# Patient Record
Sex: Female | Born: 1955 | Race: White | Hispanic: No | Marital: Married | Smoking: Former smoker
Health system: Southern US, Community
[De-identification: ages and names within clinical notes are randomized; demographics above are authoritative.]

## PROBLEM LIST (undated history)

## (undated) DIAGNOSIS — L570 Actinic keratosis: Secondary | ICD-10-CM

## (undated) DIAGNOSIS — E785 Hyperlipidemia, unspecified: Secondary | ICD-10-CM

## (undated) HISTORY — DX: Actinic keratosis: L57.0

## (undated) HISTORY — PX: TONSILLECTOMY: SUR1361

## (undated) HISTORY — DX: Hyperlipidemia, unspecified: E78.5

---

## 2006-03-13 ENCOUNTER — Ambulatory Visit: Payer: Self-pay | Admitting: Obstetrics & Gynecology

## 2007-10-15 ENCOUNTER — Ambulatory Visit: Payer: Self-pay | Admitting: Obstetrics & Gynecology

## 2008-01-28 ENCOUNTER — Ambulatory Visit: Payer: Self-pay | Admitting: Gastroenterology

## 2010-02-01 ENCOUNTER — Ambulatory Visit: Payer: Self-pay | Admitting: Obstetrics & Gynecology

## 2010-02-15 ENCOUNTER — Ambulatory Visit: Payer: Self-pay | Admitting: Obstetrics & Gynecology

## 2010-02-18 ENCOUNTER — Ambulatory Visit: Payer: Self-pay | Admitting: Obstetrics & Gynecology

## 2011-03-06 ENCOUNTER — Ambulatory Visit: Payer: Self-pay | Admitting: Obstetrics & Gynecology

## 2012-01-10 IMAGING — US ULTRASOUND LEFT BREAST
1 series · 8 of 8 positions shown · non-contrast
Comparison: none

REASON FOR EXAM: NODULARITY
COMMENTS:

PROCEDURE:     US  - US BREAST LEFT  - February 18, 2010 [DATE]
RESULT:     Ultrasound was performed from the 8 o'clock to the 10 o'clock
position.  No discrete cystic or solid mass is demonstrated. The echotexture
of the imaged parenchyma is normal.

[Series 1: ultrasound left breast · 8 of 8 slices shown]
[im 1/8]
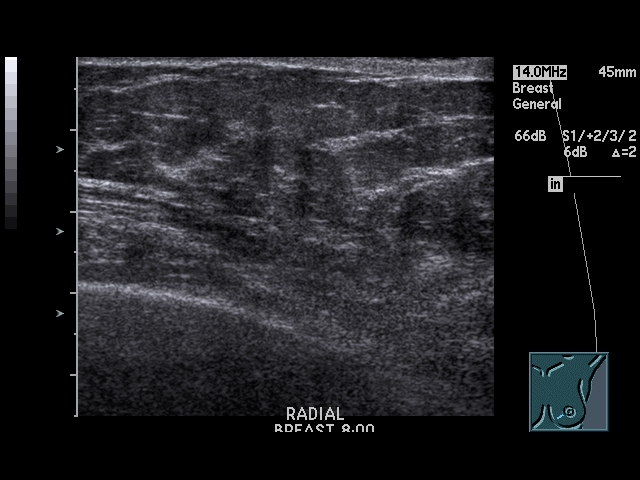
[im 2/8]
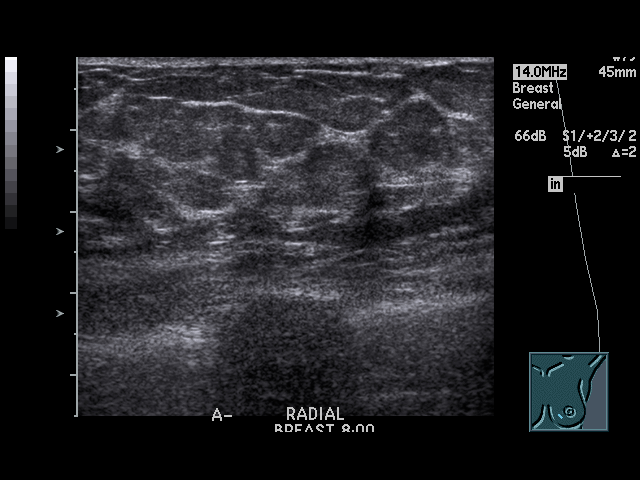
[im 3/8]
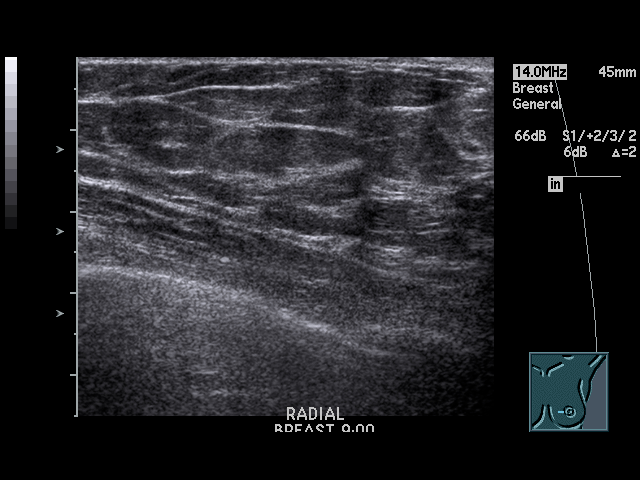
[im 4/8]
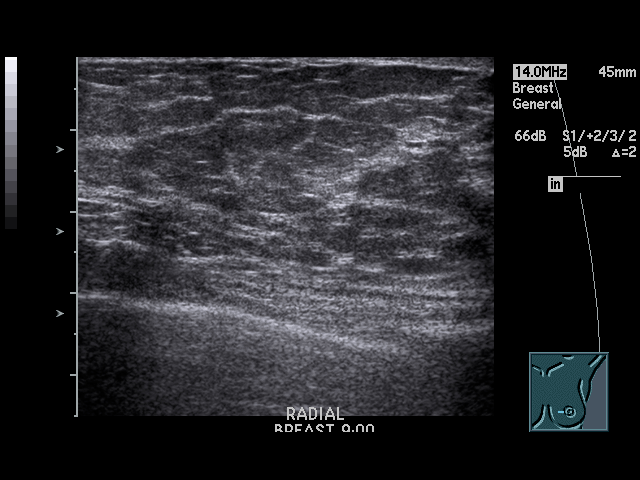
[im 5/8]
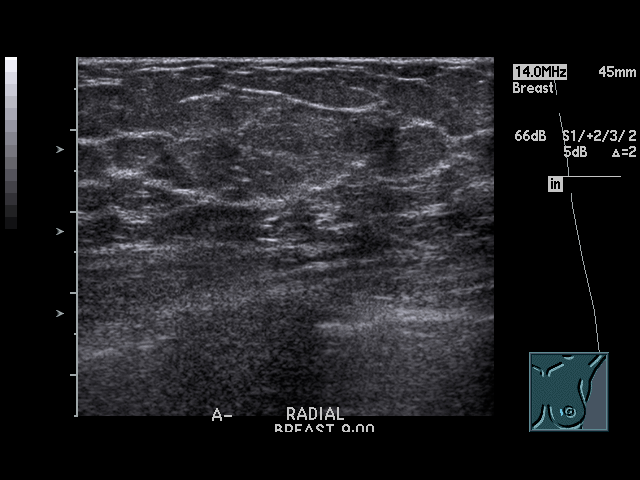
[im 6/8]
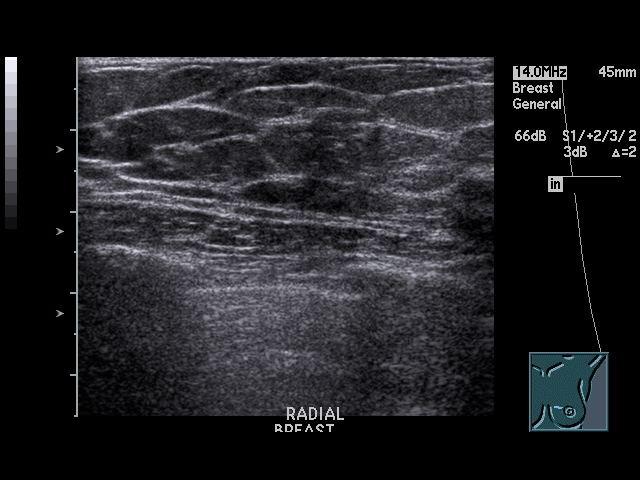
[im 7/8]
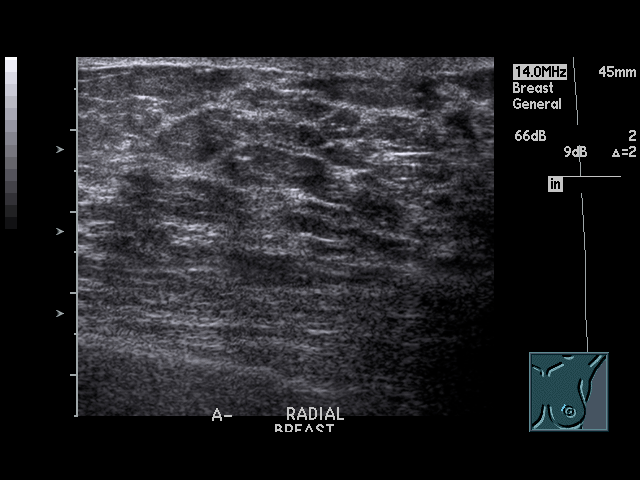
[im 8/8]
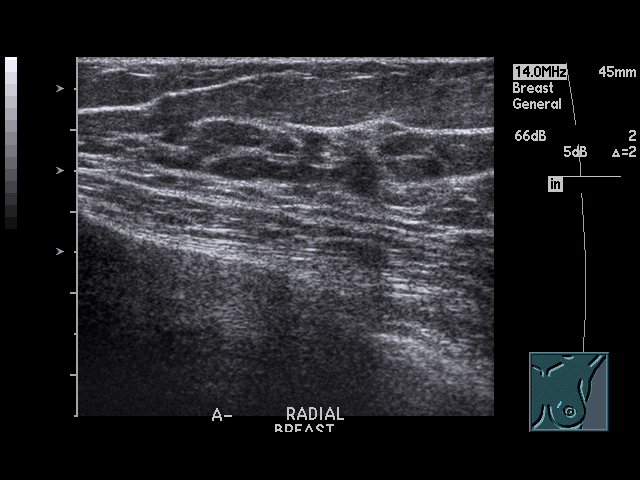

[8 of 8 positions shown; findings below may reference images not displayed]

IMPRESSION: 1.I see no ultrasonic abnormality on this directed ultrasound. Please see
the dictation of the supplemental mammographic views of the left breast this
same day for final recommendations and BI-RADS classification.

## 2012-08-15 ENCOUNTER — Ambulatory Visit: Payer: Self-pay | Admitting: Family Medicine

## 2014-07-10 LAB — LIPID PANEL
Cholesterol: 200 mg/dL (ref 0–200)
HDL: 53 mg/dL (ref 35–70)
LDL Cholesterol: 135 mg/dL
Triglycerides: 61 mg/dL (ref 40–160)

## 2014-07-10 LAB — BASIC METABOLIC PANEL
BUN: 15 mg/dL (ref 4–21)
CREATININE: 0.9 mg/dL (ref 0.5–1.1)
GLUCOSE: 92 mg/dL
POTASSIUM: 4.8 mmol/L (ref 3.4–5.3)
Sodium: 138 mmol/L (ref 137–147)

## 2015-05-26 DIAGNOSIS — E559 Vitamin D deficiency, unspecified: Secondary | ICD-10-CM | POA: Insufficient documentation

## 2015-05-26 DIAGNOSIS — I8390 Asymptomatic varicose veins of unspecified lower extremity: Secondary | ICD-10-CM | POA: Insufficient documentation

## 2015-05-26 DIAGNOSIS — L719 Rosacea, unspecified: Secondary | ICD-10-CM | POA: Insufficient documentation

## 2015-05-26 DIAGNOSIS — E7800 Pure hypercholesterolemia, unspecified: Secondary | ICD-10-CM | POA: Insufficient documentation

## 2015-05-28 ENCOUNTER — Other Ambulatory Visit: Payer: Self-pay | Admitting: Family Medicine

## 2015-05-28 DIAGNOSIS — Z1231 Encounter for screening mammogram for malignant neoplasm of breast: Secondary | ICD-10-CM

## 2015-06-07 ENCOUNTER — Ambulatory Visit: Payer: Self-pay

## 2015-07-13 ENCOUNTER — Encounter: Payer: Self-pay | Admitting: Family Medicine

## 2015-07-14 ENCOUNTER — Encounter: Payer: Self-pay | Admitting: Family Medicine

## 2015-07-14 ENCOUNTER — Ambulatory Visit (INDEPENDENT_AMBULATORY_CARE_PROVIDER_SITE_OTHER): Payer: BC Managed Care – PPO | Admitting: Family Medicine

## 2015-07-14 VITALS — BP 108/66 | HR 70 | Temp 98.6°F | Resp 14 | Ht 65.0 in | Wt 154.6 lb

## 2015-07-14 DIAGNOSIS — J309 Allergic rhinitis, unspecified: Secondary | ICD-10-CM | POA: Insufficient documentation

## 2015-07-14 DIAGNOSIS — E559 Vitamin D deficiency, unspecified: Secondary | ICD-10-CM

## 2015-07-14 DIAGNOSIS — Z0000 Encounter for general adult medical examination without abnormal findings: Secondary | ICD-10-CM | POA: Diagnosis not present

## 2015-07-14 NOTE — Patient Instructions (Signed)
We will call you with the lab results. 

## 2015-07-14 NOTE — Progress Notes (Signed)
Subjective:     Patient ID: Paula Hodges, female   DOB: Dec 28, 1955, 59 y.o.   MRN: [id number]  HPI  Chief Complaint  Patient presents with  . Annual Exam    Patient is present in office today for her annual physical, she reports that she has no concerns or questions today. Last pap smear was 06/05/13 (WNL,HPV negative), mammogram is overdue, Tdap 11/15/11, Flu vaccine 05/2015.  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   Review of Systems General: Feeling well HEENT: regular dental visits and recent eye exam Cardiovascular: no chest pain, shortness of breath, or palpitations GI: no heartburn, no change in bowel habits or blood in the stool. Due colonoscopy in 2019. GU:  no change in bladder habits. Defers mammogram: "If I have cancer I don't want to know." Repeat pap/HPV in 2019. Psychiatric: not depressed Musculoskeletal: no joint pain. States she takes Vitamin D supplement in the winter. Skin: sees a dermatologist annually.    Objective:   Physical Exam  Constitutional: She appears well-developed and well-nourished. No distress.  Eyes: PERRLA, EOMI Neck: no thyromegaly, tenderness or nodules, no carotid bruits ENT: TM's intact without inflammation; No tonsillar enlargement or exudate, Lungs: Clear Breasts: no masses/ nipple discharge; no axillary adenopathy Heart : RRR without murmur or gallop Abd: bowel sounds present, soft, non-tender, no organomegaly Extremities: no edema.      Assessment:    1. Annual physical exam - Comprehensive metabolic panel - Lipid panel  2. Vitamin D deficiency - Vitamin D (25 hydroxy)    Plan:    Further f/u pending lab work. Asked her to reconsider mammogram as she did not get last year.

## 2015-07-15 ENCOUNTER — Telehealth: Payer: Self-pay

## 2015-07-15 LAB — COMPREHENSIVE METABOLIC PANEL
A/G RATIO: 2 (ref 1.1–2.5)
ALT: 16 IU/L (ref 0–32)
AST: 19 IU/L (ref 0–40)
Albumin: 4.4 g/dL (ref 3.5–5.5)
Alkaline Phosphatase: 60 IU/L (ref 39–117)
BILIRUBIN TOTAL: 0.7 mg/dL (ref 0.0–1.2)
BUN/Creatinine Ratio: 13 (ref 9–23)
BUN: 11 mg/dL (ref 6–24)
CALCIUM: 9.3 mg/dL (ref 8.7–10.2)
CHLORIDE: 100 mmol/L (ref 96–106)
CO2: 24 mmol/L (ref 18–29)
Creatinine, Ser: 0.86 mg/dL (ref 0.57–1.00)
GFR calc Af Amer: 86 mL/min/{1.73_m2} (ref >59)
GFR calc non Af Amer: 74 mL/min/{1.73_m2} (ref >59)
Globulin, Total: 2.2 g/dL (ref 1.5–4.5)
Glucose: 96 mg/dL (ref 65–99)
POTASSIUM: 4.3 mmol/L (ref 3.5–5.2)
Sodium: 140 mmol/L (ref 134–144)
Total Protein: 6.6 g/dL (ref 6.0–8.5)

## 2015-07-15 LAB — LIPID PANEL
Chol/HDL Ratio: 3.7 ratio units (ref 0.0–4.4)
Cholesterol, Total: 200 mg/dL — ABNORMAL HIGH (ref 100–199)
HDL: 54 mg/dL (ref >39)
LDL Calculated: 132 mg/dL — ABNORMAL HIGH (ref 0–99)
TRIGLYCERIDES: 70 mg/dL (ref 0–149)
VLDL Cholesterol Cal: 14 mg/dL (ref 5–40)

## 2015-07-15 LAB — VITAMIN D 25 HYDROXY (VIT D DEFICIENCY, FRACTURES): Vit D, 25-Hydroxy: 29.9 ng/mL — ABNORMAL LOW (ref 30.0–100.0)

## 2015-07-15 NOTE — Telephone Encounter (Signed)
Patient has been advised of lab report. KW 

## 2015-07-15 NOTE — Telephone Encounter (Signed)
-----   Message from Carmon Ginsberg, [place] sent at 07/15/2015  7:45 AM EST ----- Labs look good.  Your risk of developing cardiovascular disease over the next 10 years remains low at 2.2%.

## 2016-12-12 ENCOUNTER — Ambulatory Visit (INDEPENDENT_AMBULATORY_CARE_PROVIDER_SITE_OTHER): Payer: BC Managed Care – PPO | Admitting: Family Medicine

## 2016-12-12 ENCOUNTER — Encounter: Payer: Self-pay | Admitting: Family Medicine

## 2016-12-12 VITALS — BP 108/80 | HR 77 | Temp 98.4°F | Resp 16 | Ht 64.5 in | Wt 148.6 lb

## 2016-12-12 DIAGNOSIS — Z0000 Encounter for general adult medical examination without abnormal findings: Secondary | ICD-10-CM

## 2016-12-12 DIAGNOSIS — E7800 Pure hypercholesterolemia, unspecified: Secondary | ICD-10-CM

## 2016-12-12 DIAGNOSIS — M7061 Trochanteric bursitis, right hip: Secondary | ICD-10-CM | POA: Diagnosis not present

## 2016-12-12 DIAGNOSIS — E559 Vitamin D deficiency, unspecified: Secondary | ICD-10-CM

## 2016-12-12 NOTE — Progress Notes (Signed)
Subjective:     Patient ID: Paula Hodges, female   DOB: [birth year], 61 y.o.   MRN: [id number]  HPI  Chief Complaint  Patient presents with  . Annual Exam    Patient is here today for her annual physical she states that she has been having pain in her right leg since Friday  12/08/16 while sleeping. Patient reports she she has pain in her legs often and wasnt sure if it could be related to tick bite that she pulled off her left leg on 11/30/16. 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 She cares for and rides horses as her primary exercise. Reports she has been squatting a lot playing with her grandchildren prior to the onset of her right leg pain. States it was so sore that she could not lie on her right side.Has improved with the use of ibuprofen.    Review of Systems General: Feeling well, Still does not wish to get a mammogram HEENT: regular dental visits and eye exams (reading glasses) Cardiovascular: no chest pain, shortness of breath, dizziness or palpitations GI: no heartburn, no change in bowel habits or blood in the stool. Due colonoscopy in 2019. GU:  no change in bladder habits, Due pap in 2019 (last in 2014 with negative HPV). Psychiatric: not depressed Musculoskeletal: right leg pain as above. States she takes Vitamin D in the winter. Skin: states she gets annual skin survey from dermatology.    Objective:   Physical Exam Eyes: PERRLA, EOMI Neck: no thyromegaly, tenderness or nodules, no carotid bruits or cervical adenopathy. ENT: TM's intact without inflammation; No tonsillar enlargement or exudate, Lungs: Clear Breasts: no breast mass or nipple discharge, no axillary adenopathy Heart : RRR without murmur or gallop Abd: bowel sounds present, soft, non-tender, no  organomegaly Extremities: no edema, Mild tenderness over her right trochanteric area. M.S. In lower extremities 5/5 with SLR's to 90 degrees without back pain or radiating pain.     Assessment:    1. Annual physical exam - Comprehensive metabolic panel  2. Hypercholesterolemia without hypertriglyceridemia - Lipid panel  3. Vitamin D deficiency - VITAMIN D 25 Hydroxy (Vit-D Deficiency, Fractures)  4. Trochanteric bursitis of right hip: improving    Plan:    Further f/u pending lab work. Consider Shingrix vaccine and a mammogram. Continue ibuprofen for leg pain.

## 2016-12-12 NOTE — Patient Instructions (Addendum)
Consider getting Shingrix vaccine and a mammogram. We will call you with the lab results. Continue ibuprofen for your leg.

## 2016-12-13 ENCOUNTER — Telehealth: Payer: Self-pay

## 2016-12-13 LAB — COMPREHENSIVE METABOLIC PANEL
A/G RATIO: 1.7 (ref 1.2–2.2)
ALBUMIN: 4.3 g/dL (ref 3.6–4.8)
ALK PHOS: 56 IU/L (ref 39–117)
ALT: 11 IU/L (ref 0–32)
AST: 19 IU/L (ref 0–40)
BILIRUBIN TOTAL: 0.8 mg/dL (ref 0.0–1.2)
BUN/Creatinine Ratio: 13 (ref 12–28)
BUN: 10 mg/dL (ref 8–27)
CHLORIDE: 98 mmol/L (ref 96–106)
CO2: 23 mmol/L (ref 18–29)
Calcium: 9.5 mg/dL (ref 8.7–10.3)
Creatinine, Ser: 0.77 mg/dL (ref 0.57–1.00)
GFR calc Af Amer: 97 mL/min/{1.73_m2} (ref >59)
GFR calc non Af Amer: 84 mL/min/{1.73_m2} (ref >59)
GLUCOSE: 94 mg/dL (ref 65–99)
Globulin, Total: 2.5 g/dL (ref 1.5–4.5)
POTASSIUM: 5.3 mmol/L — AB (ref 3.5–5.2)
Sodium: 138 mmol/L (ref 134–144)
TOTAL PROTEIN: 6.8 g/dL (ref 6.0–8.5)

## 2016-12-13 LAB — VITAMIN D 25 HYDROXY (VIT D DEFICIENCY, FRACTURES): VIT D 25 HYDROXY: 30.1 ng/mL (ref 30.0–100.0)

## 2016-12-13 LAB — LIPID PANEL
CHOLESTEROL TOTAL: 169 mg/dL (ref 100–199)
Chol/HDL Ratio: 3.5 ratio (ref 0.0–4.4)
HDL: 48 mg/dL (ref >39)
LDL Calculated: 107 mg/dL — ABNORMAL HIGH (ref 0–99)
Triglycerides: 71 mg/dL (ref 0–149)
VLDL CHOLESTEROL CAL: 14 mg/dL (ref 5–40)

## 2016-12-13 NOTE — Telephone Encounter (Signed)
-----   Message from Carmon Ginsberg, [place] sent at 12/13/2016  7:39 AM EDT ----- Your labs look good. Your cholesterol is minimally elevated with a low 10 year calculated risk of developing cardiovascular disease of 2.3%.

## 2016-12-13 NOTE — Telephone Encounter (Signed)
Patient advised.KW 

## 2017-06-25 ENCOUNTER — Other Ambulatory Visit: Payer: Self-pay | Admitting: Family Medicine

## 2017-06-25 DIAGNOSIS — Z1231 Encounter for screening mammogram for malignant neoplasm of breast: Secondary | ICD-10-CM

## 2017-07-16 ENCOUNTER — Ambulatory Visit
Admission: RE | Admit: 2017-07-16 | Discharge: 2017-07-16 | Disposition: A | Discharge status: Home / Self Care | Payer: BC Managed Care – PPO | Source: Ambulatory Visit | Attending: Family Medicine | Admitting: Family Medicine

## 2017-07-16 DIAGNOSIS — Z1231 Encounter for screening mammogram for malignant neoplasm of breast: Secondary | ICD-10-CM | POA: Diagnosis not present

## 2017-12-14 ENCOUNTER — Encounter: Payer: Self-pay | Admitting: Family Medicine

## 2017-12-14 ENCOUNTER — Ambulatory Visit (INDEPENDENT_AMBULATORY_CARE_PROVIDER_SITE_OTHER): Payer: BC Managed Care – PPO | Admitting: Family Medicine

## 2017-12-14 VITALS — BP 100/70 | HR 64 | Temp 97.7°F | Resp 16 | Ht 65.5 in | Wt 146.0 lb

## 2017-12-14 DIAGNOSIS — E7800 Pure hypercholesterolemia, unspecified: Secondary | ICD-10-CM | POA: Diagnosis not present

## 2017-12-14 DIAGNOSIS — Z0000 Encounter for general adult medical examination without abnormal findings: Secondary | ICD-10-CM

## 2017-12-14 NOTE — Progress Notes (Signed)
  Subjective:     Patient ID: Paula Hodges, female   DOB: 1956-06-27, 62 y.o.   MRN: [id number] Chief Complaint  Patient presents with  . Annual Exam    Patient comes in office today for her annual physical she states that she feels well today and has no concerns to address. Patient is following a well balanced diet and staying active daily with farm work, patient states that she sleeps on average 8hrs a night. Patients last reported Tdap 11/15/11, Pap smear 2014, mammogram 12/17/108 and BMD 02/01/10. Patient is due for colonoscopy, pap smear and BMD, patient states that she does monthly breast self checks and has not noticed in change in her breast.    HPI States she will be going to [facility] ob-gyn for health maintenance this summer. Reports annual dermatology exam. Defers colonoscopy/Cologard. 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  Review of Systems General: Feeling well HEENT: regular dental visits and will be updating eye exam (readers) Cardiovascular: no chest pain, shortness of breath, or palpitations GI: no heartburn, no change in bowel habits or blood in the stool GU:  no change in bladder habits; due this year for pap. Psychiatric: not depressed Musculoskeletal: no joint pain    Objective:   Physical Exam  Constitutional: She appears well-developed and well-nourished. No distress.  Eyes: PERRLA, EOMI Neck: no thyromegaly, tenderness or nodules, no cervical adenopathy, no carotid bruits ENT: TM's intact without inflammation; No tonsillar enlargement or exudate, Lungs: Clear Heart : RRR without murmur or gallop Abd: bowel sounds present, soft, non-tender, no organomegaly Extremities: no edema Skin: seb.keratosis on her neck     Assessment:    1. Annual physical exam - Comprehensive metabolic panel  2. Hypercholesterolemia without hypertriglyceridemia - Lipid panel    Plan:    Rx for Shingrix vaccine.Further f/u pending lab results. Encouraged colon cancer screening and  ob-gyn followup.

## 2017-12-14 NOTE — Patient Instructions (Signed)
We will call you with the lab results. Do update your gyn exam this summer. Get Shingrix vaccine when available. Consider colonoscopy or Cologard for colon cancer screening.

## 2017-12-15 LAB — COMPREHENSIVE METABOLIC PANEL
A/G RATIO: 2.1 (ref 1.2–2.2)
ALT: 13 IU/L (ref 0–32)
AST: 18 IU/L (ref 0–40)
Albumin: 4.5 g/dL (ref 3.6–4.8)
Alkaline Phosphatase: 58 IU/L (ref 39–117)
BUN/Creatinine Ratio: 15 (ref 12–28)
BUN: 12 mg/dL (ref 8–27)
Bilirubin Total: 0.6 mg/dL (ref 0.0–1.2)
CALCIUM: 9.3 mg/dL (ref 8.7–10.3)
CO2: 23 mmol/L (ref 20–29)
CREATININE: 0.81 mg/dL (ref 0.57–1.00)
Chloride: 99 mmol/L (ref 96–106)
GFR, EST AFRICAN AMERICAN: 91 mL/min/{1.73_m2} (ref >59)
GFR, EST NON AFRICAN AMERICAN: 79 mL/min/{1.73_m2} (ref >59)
Globulin, Total: 2.1 g/dL (ref 1.5–4.5)
Glucose: 87 mg/dL (ref 65–99)
POTASSIUM: 4.3 mmol/L (ref 3.5–5.2)
Sodium: 137 mmol/L (ref 134–144)
TOTAL PROTEIN: 6.6 g/dL (ref 6.0–8.5)

## 2017-12-15 LAB — LIPID PANEL
Chol/HDL Ratio: 3.5 ratio (ref 0.0–4.4)
Cholesterol, Total: 180 mg/dL (ref 100–199)
HDL: 51 mg/dL (ref >39)
LDL Calculated: 116 mg/dL — ABNORMAL HIGH (ref 0–99)
Triglycerides: 65 mg/dL (ref 0–149)
VLDL Cholesterol Cal: 13 mg/dL (ref 5–40)

## 2017-12-25 ENCOUNTER — Telehealth: Payer: Self-pay

## 2017-12-25 NOTE — Telephone Encounter (Signed)
Left message advising pt. OK per DPR. 

## 2017-12-25 NOTE — Telephone Encounter (Signed)
-----   Message from Carmon Ginsberg, [place] sent at 12/17/2017  7:34 AM EDT ----- Labs look good. Mild elevation of LDL cholesterol but your calcualted 10 year risk for developing cardiovascular disease is low at 2.7 %. We usually recommend cholesterol lowering drugs at 7.5% risk.

## 2019-03-11 NOTE — Progress Notes (Signed)
Subjective:    Patient ID: Paula Hodges, female    DOB: 07-Jan-1956, 63 y.o.   MRN: [id number]  HPI:  Paula Hodges is here to establish as a new pt.  Paula Hodges is a pleasant 63 year old female. PMH:HLD, Vit D Def Paula Hodges leads a VERY active life- 7 days a week of walking, horse back riding, and yoga Paula Hodges drinks >80 oz water/day Paula Hodges grown most of her own food Paula Hodges follow heart healthy diet Paula Hodges denies tobacco/vape/ETOH use Paula Hodges will retire next month-congratulations Paula Hodges reports decline in vision- has not had dilated eye examination in >3 years- recommending returning to her established Ophthalmologist   Patient Care Team    Relationship Specialty Notifications Start End  Carmon Ginsberg, [place] PCP - General Family Medicine  12/12/16     Patient Active Problem List   Diagnosis Date Noted  . Healthcare maintenance 03/12/2019  . Hypercholesterolemia without hypertriglyceridemia 05/26/2015  . Vitamin D deficiency 05/26/2015     Past Medical History:  Diagnosis Date  . Hyperlipidemia      Past Surgical History:  Procedure Laterality Date  . TONSILLECTOMY       Family History  Problem Relation Age of Onset  . Lymphoma Mother   . Lung cancer Father   . Liver cancer Father   . Hypertension Father   . Hyperlipidemia Father   . Obesity Sister   . Hypertension Brother   . Hyperlipidemia Brother   . Diabetes Paternal Uncle      Social History   Substance and Sexual Activity  Drug Use No     Social History   Substance and Sexual Activity  Alcohol Use Not Currently  . Alcohol/week: 0.0 standard drinks     Social History   Tobacco Use  Smoking Status Former Smoker  . Years: 10.00  . Quit date: 08/01/1979  . Years since quitting: 39.6  Smokeless Tobacco Never Used     Outpatient Encounter Medications as of 03/12/2019  Medication Sig Note  . MULTIPLE VITAMIN PO Take by mouth. 05/26/2015: Received from: [facility]  . TURMERIC PO Take by mouth daily.    No  facility-administered encounter medications on file as of 03/12/2019.     Allergies: Patient has no known allergies.  Body mass index is 24.14 kg/m.  Blood pressure 136/72, pulse 76, temperature 98.5 F (36.9 C), temperature source Oral, height 5' 5.25" (1.657 m), weight 146 lb 3.2 oz (66.3 kg), SpO2 100 %.     Review of Systems  Constitutional: Positive for fatigue. Negative for activity change, appetite change, chills, diaphoresis, fever and unexpected weight change.  HENT: Negative for congestion.   Eyes: Positive for visual disturbance.  Respiratory: Negative for cough, chest tightness, shortness of breath, wheezing and stridor.   Cardiovascular: Negative for chest pain, palpitations and leg swelling.  Gastrointestinal: Negative for abdominal distention, anal bleeding, blood in stool, constipation, diarrhea, nausea and vomiting.  Endocrine: Negative for cold intolerance, heat intolerance, polydipsia, polyphagia and polyuria.  Genitourinary: Negative for difficulty urinating and flank pain.  Musculoskeletal: Negative for arthralgias, back pain, gait problem, joint swelling, myalgias, neck pain and neck stiffness.  Skin: Negative for color change, pallor, rash and wound.  Neurological: Negative for dizziness and light-headedness.  Hematological: Negative for adenopathy. Does not bruise/bleed easily.  Psychiatric/Behavioral: Negative for agitation, behavioral problems, confusion, decreased concentration, dysphoric mood, hallucinations, self-injury, sleep disturbance and suicidal ideas. The patient is not nervous/anxious and is not hyperactive.  Objective:   Physical Exam Vitals signs and nursing note reviewed.  Constitutional:      General: Paula Hodges is not in acute distress.    Appearance: Normal appearance. Paula Hodges is normal weight. Paula Hodges is not ill-appearing, toxic-appearing or diaphoretic.  HENT:     Head: Normocephalic and atraumatic.  Eyes:     Extraocular Movements:  Extraocular movements intact.     Conjunctiva/sclera: Conjunctivae normal.     Pupils: Pupils are equal, round, and reactive to light.  Cardiovascular:     Rate and Rhythm: Normal rate and regular rhythm.     Pulses: Normal pulses.     Heart sounds: Normal heart sounds. No murmur. No friction rub. No gallop.   Skin:    General: Skin is warm and dry.     Capillary Refill: Capillary refill takes less than 2 seconds.  Neurological:     Mental Status: Paula Hodges is alert and oriented to person, place, and time.  Psychiatric:        Mood and Affect: Mood normal.        Behavior: Behavior normal.        Thought Content: Thought content normal.        Judgment: Judgment normal.       Assessment & Plan:   1. Hypercholesterolemia without hypertriglyceridemia   2. Healthcare maintenance   3. Vitamin D deficiency     Healthcare maintenance You are doing an EXCEPTIONAL jon taking care of yourself! Remain well hydrate, follow Mediterranean Diet, continue your active lifestyle! Continue to social distance and wear a mask when in public. Please schedule physical in 6 weeks, fasting labs the week prior.  Hypercholesterolemia without hypertriglyceridemia Will check lipids at CPE next month Continue healthy eating and regular exercise   Vitamin D deficiency Will check Vit D level at CPE next month    FOLLOW-UP:  Return in about 6 weeks (around 04/23/2019) for CPE, Fasting Labs.

## 2019-03-12 ENCOUNTER — Ambulatory Visit: Payer: BC Managed Care – PPO | Admitting: Adult Health

## 2019-03-12 ENCOUNTER — Encounter: Payer: Self-pay | Admitting: Adult Health

## 2019-03-12 ENCOUNTER — Other Ambulatory Visit: Payer: Self-pay

## 2019-03-12 VITALS — BP 136/72 | HR 76 | Temp 98.5°F | Ht 65.25 in | Wt 146.2 lb

## 2019-03-12 DIAGNOSIS — E7800 Pure hypercholesterolemia, unspecified: Secondary | ICD-10-CM | POA: Diagnosis not present

## 2019-03-12 DIAGNOSIS — Z0000 Encounter for general adult medical examination without abnormal findings: Secondary | ICD-10-CM | POA: Diagnosis not present

## 2019-03-12 DIAGNOSIS — E559 Vitamin D deficiency, unspecified: Secondary | ICD-10-CM | POA: Diagnosis not present

## 2019-03-12 NOTE — Assessment & Plan Note (Signed)
Will check lipids at CPE next month Continue healthy eating and regular exercise

## 2019-03-12 NOTE — Assessment & Plan Note (Signed)
You are doing an EXCEPTIONAL jon taking care of yourself! Remain well hydrate, follow Mediterranean Diet, continue your active lifestyle! Continue to social distance and wear a mask when in public. Please schedule physical in 6 weeks, fasting labs the week prior.

## 2019-03-12 NOTE — Patient Instructions (Signed)
Mediterranean Diet A Mediterranean diet refers to food and lifestyle choices that are based on the traditions of countries located on the [employer]. This way of eating has been shown to help prevent certain conditions and improve outcomes for people who have chronic diseases, like kidney disease and heart disease. What are tips for following this plan? Lifestyle  Cook and eat meals together with your family, when possible.  Drink enough fluid to keep your urine clear or pale yellow.  Be physically active every day. This includes: ? Aerobic exercise like running or swimming. ? Leisure activities like gardening, walking, or housework.  Get 7-8 hours of sleep each night.  If recommended by your health care provider, drink red wine in moderation. This means 1 glass a day for nonpregnant women and 2 glasses a day for men. A glass of wine equals 5 oz (150 mL). Reading food labels   Check the serving size of packaged foods. For foods such as rice and pasta, the serving size refers to the amount of cooked product, not dry.  Check the total fat in packaged foods. Avoid foods that have saturated fat or trans fats.  Check the ingredients list for added sugars, such as corn syrup. Shopping  At the grocery store, buy most of your food from the areas near the walls of the store. This includes: ? Fresh fruits and vegetables (produce). ? Grains, beans, nuts, and seeds. Some of these may be available in unpackaged forms or large amounts (in bulk). ? Fresh seafood. ? Poultry and eggs. ? Low-fat dairy products.  Buy whole ingredients instead of prepackaged foods.  Buy fresh fruits and vegetables in-season from local farmers markets.  Buy frozen fruits and vegetables in resealable bags.  If you do not have access to quality fresh seafood, buy precooked frozen shrimp or canned fish, such as tuna, salmon, or sardines.  Buy small amounts of raw or cooked vegetables, salads, or olives from  the deli or salad bar at your store.  Stock your pantry so you always have certain foods on hand, such as olive oil, canned tuna, canned tomatoes, rice, pasta, and beans. Cooking  Cook foods with extra-virgin olive oil instead of using butter or other vegetable oils.  Have meat as a side dish, and have vegetables or grains as your main dish. This means having meat in small portions or adding small amounts of meat to foods like pasta or stew.  Use beans or vegetables instead of meat in common dishes like chili or lasagna.  Experiment with different cooking methods. Try roasting or broiling vegetables instead of steaming or sauteing them.  Add frozen vegetables to soups, stews, pasta, or rice.  Add nuts or seeds for added healthy fat at each meal. You can add these to yogurt, salads, or vegetable dishes.  Marinate fish or vegetables using olive oil, lemon juice, garlic, and fresh herbs. Meal planning   Plan to eat 1 vegetarian meal one day each week. Try to work up to 2 vegetarian meals, if possible.  Eat seafood 2 or more times a week.  Have healthy snacks readily available, such as: ? Vegetable sticks with hummus. ? [place] yogurt. ? Fruit and nut trail mix.  Eat balanced meals throughout the week. This includes: ? Fruit: 2-3 servings a day ? Vegetables: 4-5 servings a day ? Low-fat dairy: 2 servings a day ? Fish, poultry, or lean meat: 1 serving a day ? Beans and legumes: 2 or more servings a week ?  Nuts and seeds: 1-2 servings a day ? Whole grains: 6-8 servings a day ? Extra-virgin olive oil: 3-4 servings a day  Limit red meat and sweets to only a few servings a month What are my food choices?  Mediterranean diet ? Recommended  Grains: Whole-grain pasta. Brown rice. Bulgar wheat. Polenta. Couscous. Whole-wheat bread. [place].  Vegetables: Artichokes. Beets. Broccoli. Cabbage. Carrots. Eggplant. Green beans. Chard. Kale. Spinach. Onions. Leeks. Peas. Squash.  Tomatoes. Peppers. Radishes.  Fruits: Apples. Apricots. Avocado. Berries. Bananas. Cherries. Dates. Figs. Grapes. Lemons. Melon. Oranges. Peaches. Plums. Pomegranate.  Meats and other protein foods: Beans. Almonds. Sunflower seeds. Pine nuts. Peanuts. [place]. Salmon. Scallops. Shrimp. [place]. Tilapia. Clams. Oysters. Eggs.  Dairy: Low-fat milk. Cheese. Greek yogurt.  Beverages: Water. Red wine. Herbal tea.  Fats and oils: Extra virgin olive oil. Avocado oil. Grape seed oil.  Sweets and desserts: [place] yogurt with honey. Baked apples. Poached pears. Trail mix.  Seasoning and other foods: Basil. Cilantro. Coriander. Cumin. Mint. Parsley. Sage. Rosemary. Tarragon. Garlic. Oregano. Thyme. Pepper. Balsalmic vinegar. Tahini. Hummus. Tomato sauce. Olives. Mushrooms. ? Limit these  Grains: Prepackaged pasta or rice dishes. Prepackaged cereal with added sugar.  Vegetables: Deep fried potatoes (french fries).  Fruits: Fruit canned in syrup.  Meats and other protein foods: Beef. Pork. Lamb. Poultry with skin. Hot dogs. Berniece Salines.  Dairy: Ice cream. Sour cream. Whole milk.  Beverages: Juice. Sugar-sweetened soft drinks. Beer. Liquor and spirits.  Fats and oils: Butter. Canola oil. Vegetable oil. Beef fat (tallow). Lard.  Sweets and desserts: Cookies. Cakes. Pies. Candy.  Seasoning and other foods: Mayonnaise. Premade sauces and marinades. The items listed may not be a complete list. Talk with your dietitian about what dietary choices are right for you. Summary  The Mediterranean diet includes both food and lifestyle choices.  Eat a variety of fresh fruits and vegetables, beans, nuts, seeds, and whole grains.  Limit the amount of red meat and sweets that you eat.  Talk with your health care provider about whether it is safe for you to drink red wine in moderation. This means 1 glass a day for nonpregnant women and 2 glasses a day for men. A glass of wine equals 5 oz (150 mL). This information  is not intended to replace advice given to you by your health care provider. Make sure you discuss any questions you have with your health care provider. Document Released: 03/09/2016 Document Revised: 03/16/2016 Document Reviewed: 03/09/2016 Elsevier Patient Education  2020 [employer] are doing an EXCEPTIONAL jon taking care of yourself! Remain well hydrate, follow Mediterranean Diet, continue your active lifestyle! Continue to social distance and wear a mask when in public. Please schedule physical in 6 weeks, fasting labs the week prior. WELCOME TO THE PRACTICE!

## 2019-03-12 NOTE — Assessment & Plan Note (Signed)
Will check Vit D level at CPE next month

## 2019-03-18 ENCOUNTER — Encounter: Payer: Self-pay | Admitting: Adult Health

## 2019-04-17 ENCOUNTER — Other Ambulatory Visit: Payer: BC Managed Care – PPO

## 2019-04-17 ENCOUNTER — Other Ambulatory Visit: Payer: Self-pay

## 2019-04-17 DIAGNOSIS — Z0000 Encounter for general adult medical examination without abnormal findings: Secondary | ICD-10-CM

## 2019-04-17 DIAGNOSIS — E7800 Pure hypercholesterolemia, unspecified: Secondary | ICD-10-CM

## 2019-04-18 LAB — CBC WITH DIFFERENTIAL/PLATELET
Basophils Absolute: 0 10*3/uL (ref 0.0–0.2)
Basos: 1 %
EOS (ABSOLUTE): 0.1 10*3/uL (ref 0.0–0.4)
Eos: 3 %
Hematocrit: 43.7 % (ref 34.0–46.6)
Hemoglobin: 14.2 g/dL (ref 11.1–15.9)
Immature Grans (Abs): 0 10*3/uL (ref 0.0–0.1)
Immature Granulocytes: 0 %
Lymphocytes Absolute: 1.4 10*3/uL (ref 0.7–3.1)
Lymphs: 29 %
MCH: 29.6 pg (ref 26.6–33.0)
MCHC: 32.5 g/dL (ref 31.5–35.7)
MCV: 91 fL (ref 79–97)
Monocytes Absolute: 0.4 10*3/uL (ref 0.1–0.9)
Monocytes: 9 %
Neutrophils Absolute: 3 10*3/uL (ref 1.4–7.0)
Neutrophils: 58 %
Platelets: 204 10*3/uL (ref 150–450)
RBC: 4.79 x10E6/uL (ref 3.77–5.28)
RDW: 11.8 % (ref 11.7–15.4)
WBC: 5 10*3/uL (ref 3.4–10.8)

## 2019-04-18 LAB — COMPREHENSIVE METABOLIC PANEL
ALT: 10 IU/L (ref 0–32)
AST: 20 IU/L (ref 0–40)
Albumin/Globulin Ratio: 2.2 (ref 1.2–2.2)
Albumin: 4.6 g/dL (ref 3.8–4.8)
Alkaline Phosphatase: 69 IU/L (ref 39–117)
BUN/Creatinine Ratio: 10 — ABNORMAL LOW (ref 12–28)
BUN: 9 mg/dL (ref 8–27)
Bilirubin Total: 0.8 mg/dL (ref 0.0–1.2)
CO2: 25 mmol/L (ref 20–29)
Calcium: 9.5 mg/dL (ref 8.7–10.3)
Chloride: 98 mmol/L (ref 96–106)
Creatinine, Ser: 0.89 mg/dL (ref 0.57–1.00)
GFR calc Af Amer: 80 mL/min/{1.73_m2} (ref >59)
GFR calc non Af Amer: 69 mL/min/{1.73_m2} (ref >59)
Globulin, Total: 2.1 g/dL (ref 1.5–4.5)
Glucose: 85 mg/dL (ref 65–99)
Potassium: 4.6 mmol/L (ref 3.5–5.2)
Sodium: 136 mmol/L (ref 134–144)
Total Protein: 6.7 g/dL (ref 6.0–8.5)

## 2019-04-18 LAB — LIPID PANEL
Chol/HDL Ratio: 3.6 ratio (ref 0.0–4.4)
Cholesterol, Total: 204 mg/dL — ABNORMAL HIGH (ref 100–199)
HDL: 56 mg/dL (ref >39)
LDL Chol Calc (NIH): 135 mg/dL — ABNORMAL HIGH (ref 0–99)
Triglycerides: 72 mg/dL (ref 0–149)
VLDL Cholesterol Cal: 13 mg/dL (ref 5–40)

## 2019-04-18 LAB — HEMOGLOBIN A1C
Est. average glucose Bld gHb Est-mCnc: 108 mg/dL
Hgb A1c MFr Bld: 5.4 % (ref 4.8–5.6)

## 2019-04-18 LAB — TSH: TSH: 4.05 u[IU]/mL (ref 0.450–4.500)

## 2019-04-23 ENCOUNTER — Encounter: Payer: BC Managed Care – PPO | Admitting: Adult Health

## 2019-05-15 ENCOUNTER — Encounter: Payer: Self-pay | Admitting: Adult Health

## 2019-05-15 ENCOUNTER — Other Ambulatory Visit: Payer: Self-pay

## 2019-05-15 ENCOUNTER — Ambulatory Visit (INDEPENDENT_AMBULATORY_CARE_PROVIDER_SITE_OTHER): Payer: BC Managed Care – PPO | Admitting: Adult Health

## 2019-05-15 VITALS — BP 119/70 | HR 65 | Temp 98.3°F | Ht 65.25 in | Wt 144.2 lb

## 2019-05-15 DIAGNOSIS — E7800 Pure hypercholesterolemia, unspecified: Secondary | ICD-10-CM | POA: Diagnosis not present

## 2019-05-15 DIAGNOSIS — Z23 Encounter for immunization: Secondary | ICD-10-CM

## 2019-05-15 DIAGNOSIS — Z1211 Encounter for screening for malignant neoplasm of colon: Secondary | ICD-10-CM

## 2019-05-15 DIAGNOSIS — Z0000 Encounter for general adult medical examination without abnormal findings: Secondary | ICD-10-CM | POA: Diagnosis not present

## 2019-05-15 NOTE — Assessment & Plan Note (Signed)
The 10-year ASCVD risk score Mikey Bussing DC Brooke Bonito., et al., 2013) is: 5.1%  Values used to calculate the score:   Age: 63 years   Sex: Female   Is Non-Hispanic African American: No   Diabetic: No   Tobacco smoker: No   Systolic Blood Pressure: [id number] mmHg   Is BP treated: No   HDL Cholesterol: 56 mg/dL   Total Cholesterol: 204 mg/dL  LDL-135  Discussed statin therapy, she will focus on reduction of saturated fat and continue her active lifestyle- will re-check lipids in 6 months

## 2019-05-15 NOTE — Progress Notes (Signed)
Subjective:    Patient ID: Paula Hodges, female    DOB: 1956/04/18, 63 y.o.   MRN: [id number]  HPI: 03/12/2019 OV: Paula Hodges is here to establish as a new pt.  She is a pleasant 63 year old female. PMH:HLD, Vit D Def She leads a VERY active life- 7 days a week of walking, horse back riding, and yoga She drinks >80 oz water/day She grown most of her own food She follow heart healthy diet She denies tobacco/vape/ETOH use She will retire next month-congratulations She reports decline in vision- has not had dilated eye examination in >3 years- recommending returning to her established Ophthalmologist  05/15/2019 OV: Paula Hodges is here for CPE She is officially retired as of [employer]! 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 She continues to drink >80 oz water/day She eats more a plant based diet. She recently had dilated eye exam- now has progressive lenses. She has upcoming appt with Derm for annual "mole check".  04/17/2019 Labs- TSH-WNL, 4.050  A1c-5.4  CMP-stable  CBC-stable  The 10-year ASCVD risk score Mikey Bussing DC Jr., et al., 2013) is: 5.1%  Values used to calculate the score:   Age: 63 years   Sex: Female   Is Non-Hispanic African American: No   Diabetic: No   Tobacco smoker: No   Systolic Blood Pressure: XX123456 mmHg   Is BP treated: No   HDL Cholesterol: 56 mg/dL   Total Cholesterol: 204 mg/dL  LDL-135  Discussed statin therapy, she will focus on reduction of saturated fat and continue her active lifestyle- will re-check lipids in 6 months  Healthcare Maintenance: PAP-declined Mammogram-declined Colonoscopy-2007, repeat in 10 years. Declined Colonoscopy, agreed to Cologuard Immunizations- Influenza updated todated  Patient Care Team    Relationship Specialty Notifications Start End  Esaw Grandchild, NP PCP - General Family Medicine  03/18/19   Lucilla Lame, MD Consulting Physician  Gastroenterology  03/18/19   [facility]  Diagnostic Radiology  03/18/19     Patient Active Problem List   Diagnosis Date Noted  . Healthcare maintenance 03/12/2019  . Hypercholesterolemia without hypertriglyceridemia 05/26/2015  . Vitamin D deficiency 05/26/2015     Past Medical History:  Diagnosis Date  . Hyperlipidemia      Past Surgical History:  Procedure Laterality Date  . TONSILLECTOMY       Family History  Problem Relation Age of Onset  . Lymphoma Mother   . Lung cancer Father   . Liver cancer Father   . Hypertension Father   . Hyperlipidemia Father   . Obesity Sister   . Hypertension Brother   . Hyperlipidemia Brother   . Diabetes Paternal Uncle      Social History   Substance and Sexual Activity  Drug Use No     Social History   Substance and Sexual Activity  Alcohol Use Not Currently  . Alcohol/week: 0.0 standard drinks     Social History   Tobacco Use  Smoking Status Former Smoker  . Years: 10.00  . Quit date: 08/01/1979  . Years since quitting: 39.8  Smokeless Tobacco Never Used     Outpatient Encounter Medications as of 05/15/2019  Medication Sig Note  . MULTIPLE VITAMIN PO Take by mouth. 05/26/2015: Received from: [facility]  . TURMERIC PO Take by mouth daily.    No facility-administered encounter medications on file as of 05/15/2019.     Allergies: Patient  has no known allergies.  There is no height or weight on file to calculate BMI.  There were no vitals taken for this visit.  Review of Systems  Constitutional: Positive for fatigue. Negative for activity change, appetite change, chills, diaphoresis, fever and unexpected weight change.  HENT: Negative for congestion.   Eyes: Negative for visual disturbance.  Respiratory: Negative for cough, chest tightness, shortness of breath, wheezing and stridor.   Cardiovascular: Negative for chest pain, palpitations and leg swelling.   Gastrointestinal: Negative for abdominal distention, abdominal pain, blood in stool, constipation, diarrhea, nausea and vomiting.  Endocrine: Negative for cold intolerance, heat intolerance, polydipsia, polyphagia and polyuria.  Genitourinary: Negative for difficulty urinating and flank pain.  Musculoskeletal: Negative for arthralgias, back pain, gait problem, joint swelling, myalgias, neck pain and neck stiffness.  Skin: Negative for color change, pallor, rash and wound.  Neurological: Negative for dizziness and headaches.  Hematological: Negative for adenopathy. Does not bruise/bleed easily.  Psychiatric/Behavioral: Negative.  Negative for behavioral problems, confusion, decreased concentration, dysphoric mood, hallucinations, self-injury, sleep disturbance and suicidal ideas. The patient is not nervous/anxious and is not hyperactive.        Objective:   Physical Exam Vitals signs and nursing note reviewed.  Constitutional:      General: She is not in acute distress.    Appearance: Normal appearance. She is normal weight. She is not ill-appearing, toxic-appearing or diaphoretic.  HENT:     Head: Normocephalic and atraumatic.     Right Ear: Tympanic membrane, ear canal and external ear normal. There is no impacted cerumen.     Left Ear: Ear canal and external ear normal. There is no impacted cerumen.     Nose: Nose normal. No congestion.     Mouth/Throat:     Mouth: Mucous membranes are moist.     Pharynx: No oropharyngeal exudate.  Eyes:     Conjunctiva/sclera: Conjunctivae normal.     Pupils: Pupils are equal, round, and reactive to light.  Neck:     Musculoskeletal: Normal range of motion and neck supple. No muscular tenderness.  Cardiovascular:     Rate and Rhythm: Normal rate and regular rhythm.     Pulses: Normal pulses.     Heart sounds: Normal heart sounds. No murmur. No friction rub. No gallop.   Pulmonary:     Effort: Pulmonary effort is normal. No respiratory  distress.     Breath sounds: Normal breath sounds. No stridor. No wheezing, rhonchi or rales.  Chest:     Chest wall: No tenderness.     Breasts:        Right: Normal. No inverted nipple, mass or nipple discharge.        Left: Normal. No inverted nipple, mass or nipple discharge.  Abdominal:     General: Abdomen is flat. Bowel sounds are normal. There is no distension.     Palpations: There is no mass.     Tenderness: There is no abdominal tenderness. There is no right CVA tenderness, left CVA tenderness, guarding or rebound.     Hernia: No hernia is present.  Musculoskeletal: Normal range of motion.        General: No tenderness.     Right lower leg: No edema.     Left lower leg: No edema.  Skin:    General: Skin is warm.     Capillary Refill: Capillary refill takes less than 2 seconds.  Neurological:     General: No focal deficit present.  Mental Status: She is alert and oriented to person, place, and time.     Coordination: Coordination normal.  Psychiatric:        Mood and Affect: Mood normal.        Behavior: Behavior normal.        Thought Content: Thought content normal.        Judgment: Judgment normal.        Assessment & Plan:   1. Elevated LDL cholesterol level   2. Healthcare maintenance     Healthcare maintenance Blood pressure, weight, and majority of labs look very good. Elevations in total and LDL (bad) cholesterol- continue to be as active as you are and reduce saturated fat in your diet. Recommend re-checking cholesterol panel in 6 months- please make a fasting lab appt. Please complete [facility] per instructions. Continue to social distance and wear a mask when in public. Continue annual physical with fasting labs.  Elevated LDL cholesterol level The 10-year ASCVD risk score Mikey Bussing DC Jr., et al., 2013) is: 5.1%  Values used to calculate the score:   Age: 62 years   Sex: Female   Is Non-Hispanic African American: No   Diabetic:  No   Tobacco smoker: No   Systolic Blood Pressure: XX123456 mmHg   Is BP treated: No   HDL Cholesterol: 56 mg/dL   Total Cholesterol: 204 mg/dL  LDL-135  Discussed statin therapy, she will focus on reduction of saturated fat and continue her active lifestyle- will re-check lipids in 6 months    FOLLOW-UP:  Return in about 6 months (around 11/13/2019) for Fasting Labs.

## 2019-05-15 NOTE — Assessment & Plan Note (Signed)
Blood pressure, weight, and majority of labs look very good. Elevations in total and LDL (bad) cholesterol- continue to be as active as you are and reduce saturated fat in your diet. Recommend re-checking cholesterol panel in 6 months- please make a fasting lab appt. Please complete [facility] per instructions. Continue to social distance and wear a mask when in public. Continue annual physical with fasting labs.

## 2019-05-15 NOTE — Assessment & Plan Note (Signed)
>>  ASSESSMENT AND PLAN FOR ELEVATED LDL CHOLESTEROL LEVEL WRITTEN ON 05/15/2019  8:45 AM BY DANFORD, KATY D, NP  The 10-year ASCVD risk score Denman George DC Jr., et al., 2013) is: 5.1%  Values used to calculate the score:   Age: 63 years   Sex: Female   Is Non-Hispanic African American: No   Diabetic: No   Tobacco smoker: No   Systolic Blood Pressure: 136 mmHg   Is BP treated: No   HDL Cholesterol: 56 mg/dL   Total Cholesterol: 696 mg/dL  [id number]  Discussed statin therapy, she will focus on reduction of saturated fat and continue her active lifestyle- will re-check lipids in 6 months

## 2019-05-15 NOTE — Patient Instructions (Signed)
Preventive Care for Adults, Female  A healthy lifestyle and preventive care can promote health and wellness. Preventive health guidelines for women include the following key practices.   A routine yearly physical is a good way to check with your health care provider about your health and preventive screening. It is a chance to share any concerns and updates on your health and to receive a thorough exam.   Visit your dentist for a routine exam and preventive care every 6 months. Brush your teeth twice a day and floss once a day. Good oral hygiene prevents tooth decay and gum disease.   The frequency of eye exams is based on your age, health, family medical history, use of contact lenses, and other factors. Follow your health care provider's recommendations for frequency of eye exams.   Eat a healthy diet. Foods like vegetables, fruits, whole grains, low-fat dairy products, and lean protein foods contain the nutrients you need without too many calories. Decrease your intake of foods high in solid fats, added sugars, and salt. Eat the right amount of calories for you.Get information about a proper diet from your health care provider, if necessary.   Regular physical exercise is one of the most important things you can do for your health. Most adults should get at least 150 minutes of moderate-intensity exercise (any activity that increases your heart rate and causes you to sweat) each week. In addition, most adults need muscle-strengthening exercises on 2 or more days a week.   Maintain a healthy weight. The body mass index (BMI) is a screening tool to identify possible weight problems. It provides an estimate of body fat based on height and weight. Your health care provider can find your BMI, and can help you achieve or maintain a healthy weight.For adults 20 years and older:   - A BMI below 18.5 is considered underweight.   - A BMI of 18.5 to 24.9 is normal.   - A BMI of 25 to 29.9 is  considered overweight.   - A BMI of 30 and above is considered obese.   Maintain normal blood lipids and cholesterol levels by exercising and minimizing your intake of trans and saturated fats.  Eat a balanced diet with plenty of fruit and vegetables. Blood tests for lipids and cholesterol should begin at age 20 and be repeated every 5 years minimum.  If your lipid or cholesterol levels are high, you are over 40, or you are at high risk for heart disease, you may need your cholesterol levels checked more frequently.Ongoing high lipid and cholesterol levels should be treated with medicines if diet and exercise are not working.   If you smoke, find out from your health care provider how to quit. If you do not use tobacco, do not start.   Lung cancer screening is recommended for adults aged 55-80 years who are at high risk for developing lung cancer because of a history of smoking. A yearly low-dose CT scan of the lungs is recommended for people who have at least a 30-pack-year history of smoking and are a current smoker or have quit within the past 15 years. A pack year of smoking is smoking an average of 1 pack of cigarettes a day for 1 year (for example: 1 pack a day for 30 years or 2 packs a day for 15 years). Yearly screening should continue until the smoker has stopped smoking for at least 15 years. Yearly screening should be stopped for people who develop a   health problem that would prevent them from having lung cancer treatment.   If you are pregnant, do not drink alcohol. If you are breastfeeding, be very cautious about drinking alcohol. If you are not pregnant and choose to drink alcohol, do not have more than 1 drink per day. One drink is considered to be 12 ounces (355 mL) of beer, 5 ounces (148 mL) of wine, or 1.5 ounces (44 mL) of liquor.   Avoid use of street drugs. Do not share needles with anyone. Ask for help if you need support or instructions about stopping the use of  drugs.   High blood pressure causes heart disease and increases the risk of stroke. Your blood pressure should be checked at least yearly.  Ongoing high blood pressure should be treated with medicines if weight loss and exercise do not work.   If you are 69-55 years old, ask your health care provider if you should take aspirin to prevent strokes.   Diabetes screening involves taking a blood sample to check your fasting blood sugar level. This should be done once every 3 years, after age 38, if you are within normal weight and without risk factors for diabetes. Testing should be considered at a younger age or be carried out more frequently if you are overweight and have at least 1 risk factor for diabetes.   Breast cancer screening is essential preventive care for women. You should practice "breast self-awareness."  This means understanding the normal appearance and feel of your breasts and may include breast self-examination.  Any changes detected, no matter how small, should be reported to a health care provider.  Women in their 80s and 30s should have a clinical breast exam (CBE) by a health care provider as part of a regular health exam every 1 to 3 years.  After age 66, women should have a CBE every year.  Starting at age 1, women should consider having a mammogram (breast X-ray test) every year.  Women who have a family history of breast cancer should talk to their health care provider about genetic screening.  Women at a high risk of breast cancer should talk to their health care providers about having an MRI and a mammogram every year.   -Breast cancer gene (BRCA)-related cancer risk assessment is recommended for women who have family members with BRCA-related cancers. BRCA-related cancers include breast, ovarian, tubal, and peritoneal cancers. Having family members with these cancers may be associated with an increased risk for harmful changes (mutations) in the breast cancer genes BRCA1 and  BRCA2. Results of the assessment will determine the need for genetic counseling and BRCA1 and BRCA2 testing.   The Pap test is a screening test for cervical cancer. A Pap test can show cell changes on the cervix that might become cervical cancer if left untreated. A Pap test is a procedure in which cells are obtained and examined from the lower end of the uterus (cervix).   - Women should have a Pap test starting at age 57.   - Between ages 90 and 70, Pap tests should be repeated every 2 years.   - Beginning at age 63, you should have a Pap test every 3 years as long as the past 3 Pap tests have been normal.   - Some women have medical problems that increase the chance of getting cervical cancer. Talk to your health care provider about these problems. It is especially important to talk to your health care provider if a  new problem develops soon after your last Pap test. In these cases, your health care provider may recommend more frequent screening and Pap tests.   - The above recommendations are the same for women who have or have not gotten the vaccine for human papillomavirus (HPV).   - If you had a hysterectomy for a problem that was not cancer or a condition that could lead to cancer, then you no longer need Pap tests. Even if you no longer need a Pap test, a regular exam is a good idea to make sure no other problems are starting.   - If you are between ages 36 and 66 years, and you have had normal Pap tests going back 10 years, you no longer need Pap tests. Even if you no longer need a Pap test, a regular exam is a good idea to make sure no other problems are starting.   - If you have had past treatment for cervical cancer or a condition that could lead to cancer, you need Pap tests and screening for cancer for at least 20 years after your treatment.   - If Pap tests have been discontinued, risk factors (such as a new sexual partner) need to be reassessed to determine if screening should  be resumed.   - The HPV test is an additional test that may be used for cervical cancer screening. The HPV test looks for the virus that can cause the cell changes on the cervix. The cells collected during the Pap test can be tested for HPV. The HPV test could be used to screen women aged 70 years and older, and should be used in women of any age who have unclear Pap test results. After the age of 67, women should have HPV testing at the same frequency as a Pap test.   Colorectal cancer can be detected and often prevented. Most routine colorectal cancer screening begins at the age of 57 years and continues through age 26 years. However, your health care provider may recommend screening at an earlier age if you have risk factors for colon cancer. On a yearly basis, your health care provider may provide home test kits to check for hidden blood in the stool.  Use of a small camera at the end of a tube, to directly examine the colon (sigmoidoscopy or colonoscopy), can detect the earliest forms of colorectal cancer. Talk to your health care provider about this at age 23, when routine screening begins. Direct exam of the colon should be repeated every 5 -10 years through age 49 years, unless early forms of pre-cancerous polyps or small growths are found.   People who are at an increased risk for hepatitis B should be screened for this virus. You are considered at high risk for hepatitis B if:  -You were born in a country where hepatitis B occurs often. Talk with your health care provider about which countries are considered high risk.  - Your parents were born in a high-risk country and you have not received a shot to protect against hepatitis B (hepatitis B vaccine).  - You have HIV or AIDS.  - You use needles to inject street drugs.  - You live with, or have sex with, someone who has Hepatitis B.  - You get hemodialysis treatment.  - You take certain medicines for conditions like cancer, organ  transplantation, and autoimmune conditions.   Hepatitis C blood testing is recommended for all people born from [birth year] through 1965 and any individual  with known risks for hepatitis C.   Practice safe sex. Use condoms and avoid high-risk sexual practices to reduce the spread of sexually transmitted infections (STIs). STIs include gonorrhea, chlamydia, syphilis, trichomonas, herpes, HPV, and human immunodeficiency virus (HIV). Herpes, HIV, and HPV are viral illnesses that have no cure. They can result in disability, cancer, and death. Sexually active women aged 25 years and younger should be checked for chlamydia. Older women with new or multiple partners should also be tested for chlamydia. Testing for other STIs is recommended if you are sexually active and at increased risk.   Osteoporosis is a disease in which the bones lose minerals and strength with aging. This can result in serious bone fractures or breaks. The risk of osteoporosis can be identified using a bone density scan. Women ages 65 years and over and women at risk for fractures or osteoporosis should discuss screening with their health care providers. Ask your health care provider whether you should take a calcium supplement or vitamin D to There are also several preventive steps women can take to avoid osteoporosis and resulting fractures or to keep osteoporosis from worsening. -->Recommendations include:  Eat a balanced diet high in fruits, vegetables, calcium, and vitamins.  Get enough calcium. The recommended total intake of is 1,200 mg daily; for best absorption, if taking supplements, divide doses into 250-500 mg doses throughout the day. Of the two types of calcium, calcium carbonate is best absorbed when taken with food but calcium citrate can be taken on an empty stomach.  Get enough vitamin D. NAMS and the National Osteoporosis Foundation recommend at least 1,000 IU per day for women age 50 and over who are at risk of vitamin D  deficiency. Vitamin D deficiency can be caused by inadequate sun exposure (for example, those who live in northern latitudes).  Avoid alcohol and smoking. Heavy alcohol intake (more than 7 drinks per week) increases the risk of falls and hip fracture and women smokers tend to lose bone more rapidly and have lower bone mass than nonsmokers. Stopping smoking is one of the most important changes women can make to improve their health and decrease risk for disease.  Be physically active every day. Weight-bearing exercise (for example, fast walking, hiking, jogging, and weight training) may strengthen bones or slow the rate of bone loss that comes with aging. Balancing and muscle-strengthening exercises can reduce the risk of falling and fracture.  Consider therapeutic medications. Currently, several types of effective drugs are available. Healthcare providers can recommend the type most appropriate for each woman.  Eliminate environmental factors that may contribute to accidents. Falls cause nearly 90% of all osteoporotic fractures, so reducing this risk is an important bone-health strategy. Measures include ample lighting, removing obstructions to walking, using nonskid rugs on floors, and placing mats and/or grab bars in showers.  Be aware of medication side effects. Some common medicines make bones weaker. These include a type of steroid drug called glucocorticoids used for arthritis and asthma, some antiseizure drugs, certain sleeping pills, treatments for endometriosis, and some cancer drugs. An overactive thyroid gland or using too much thyroid hormone for an underactive thyroid can also be a problem. If you are taking these medicines, talk to your doctor about what you can do to help protect your bones.reduce the rate of osteoporosis.    Menopause can be associated with physical symptoms and risks. Hormone replacement therapy is available to decrease symptoms and risks. You should talk to your  health care provider   about whether hormone replacement therapy is right for you.   Use sunscreen. Apply sunscreen liberally and repeatedly throughout the day. You should seek shade when your shadow is shorter than you. Protect yourself by wearing long sleeves, pants, a wide-brimmed hat, and sunglasses year round, whenever you are outdoors.   Once a month, do a whole body skin exam, using a mirror to look at the skin on your back. Tell your health care provider of new moles, moles that have irregular borders, moles that are larger than a pencil eraser, or moles that have changed in shape or color.   -Stay current with required vaccines (immunizations).   Influenza vaccine. All adults should be immunized every year.  Tetanus, diphtheria, and acellular pertussis (Td, Tdap) vaccine. Pregnant women should receive 1 dose of Tdap vaccine during each pregnancy. The dose should be obtained regardless of the length of time since the last dose. Immunization is preferred during the 27th 36th week of gestation. An adult who has not previously received Tdap or who does not know her vaccine status should receive 1 dose of Tdap. This initial dose should be followed by tetanus and diphtheria toxoids (Td) booster doses every 10 years. Adults with an unknown or incomplete history of completing a 3-dose immunization series with Td-containing vaccines should begin or complete a primary immunization series including a Tdap dose. Adults should receive a Td booster every 10 years.  Varicella vaccine. An adult without evidence of immunity to varicella should receive 2 doses or a second dose if she has previously received 1 dose. Pregnant females who do not have evidence of immunity should receive the first dose after pregnancy. This first dose should be obtained before leaving the health care facility. The second dose should be obtained 4 8 weeks after the first dose.  Human papillomavirus (HPV) vaccine. Females aged 13 26  years who have not received the vaccine previously should obtain the 3-dose series. The vaccine is not recommended for use in pregnant females. However, pregnancy testing is not needed before receiving a dose. If a female is found to be pregnant after receiving a dose, no treatment is needed. In that case, the remaining doses should be delayed until after the pregnancy. Immunization is recommended for any person with an immunocompromised condition through the age of 26 years if she did not get any or all doses earlier. During the 3-dose series, the second dose should be obtained 4 8 weeks after the first dose. The third dose should be obtained 24 weeks after the first dose and 16 weeks after the second dose.  Zoster vaccine. One dose is recommended for adults aged 60 years or older unless certain conditions are present.  Measles, mumps, and rubella (MMR) vaccine. Adults born before 1957 generally are considered immune to measles and mumps. Adults born in 1957 or later should have 1 or more doses of MMR vaccine unless there is a contraindication to the vaccine or there is laboratory evidence of immunity to each of the three diseases. A routine second dose of MMR vaccine should be obtained at least 28 days after the first dose for students attending postsecondary schools, health care workers, or international travelers. People who received inactivated measles vaccine or an unknown type of measles vaccine during 1963 1967 should receive 2 doses of MMR vaccine. People who received inactivated mumps vaccine or an unknown type of mumps vaccine before 1979 and are at high risk for mumps infection should consider immunization with 2 doses of   MMR vaccine. For females of childbearing age, rubella immunity should be determined. If there is no evidence of immunity, females who are not pregnant should be vaccinated. If there is no evidence of immunity, females who are pregnant should delay immunization until after pregnancy.  Unvaccinated health care workers born before [birth year] who lack laboratory evidence of measles, mumps, or rubella immunity or laboratory confirmation of disease should consider measles and mumps immunization with 2 doses of MMR vaccine or rubella immunization with 1 dose of MMR vaccine.  Pneumococcal 13-valent conjugate (PCV13) vaccine. When indicated, a person who is uncertain of her immunization history and has no record of immunization should receive the PCV13 vaccine. An adult aged 54 years or older who has certain medical conditions and has not been previously immunized should receive 1 dose of PCV13 vaccine. This PCV13 should be followed with a dose of pneumococcal polysaccharide (PPSV23) vaccine. The PPSV23 vaccine dose should be obtained at least 8 weeks after the dose of PCV13 vaccine. An adult aged 58 years or older who has certain medical conditions and previously received 1 or more doses of PPSV23 vaccine should receive 1 dose of PCV13. The PCV13 vaccine dose should be obtained 1 or more years after the last PPSV23 vaccine dose.  Pneumococcal polysaccharide (PPSV23) vaccine. When PCV13 is also indicated, PCV13 should be obtained first. All adults aged 58 years and older should be immunized. An adult younger than age 65 years who has certain medical conditions should be immunized. Any person who resides in a nursing home or long-term care facility should be immunized. An adult smoker should be immunized. People with an immunocompromised condition and certain other conditions should receive both PCV13 and PPSV23 vaccines. People with human immunodeficiency virus (HIV) infection should be immunized as soon as possible after diagnosis. Immunization during chemotherapy or radiation therapy should be avoided. Routine use of PPSV23 vaccine is not recommended for American Indians, [place] Natives, or people younger than 65 years unless there are medical conditions that require PPSV23 vaccine. When indicated,  people who have unknown immunization and have no record of immunization should receive PPSV23 vaccine. One-time revaccination 5 years after the first dose of PPSV23 is recommended for people aged 70 64 years who have chronic kidney failure, nephrotic syndrome, asplenia, or immunocompromised conditions. People who received 1 2 doses of PPSV23 before age 32 years should receive another dose of PPSV23 vaccine at age 96 years or later if at least 5 years have passed since the previous dose. Doses of PPSV23 are not needed for people immunized with PPSV23 at or after age 55 years.  Meningococcal vaccine. Adults with asplenia or persistent complement component deficiencies should receive 2 doses of quadrivalent meningococcal conjugate (MenACWY-D) vaccine. The doses should be obtained at least 2 months apart. Microbiologists working with certain meningococcal bacteria, [employer] recruits, people at risk during an outbreak, and people who travel to or live in countries with a high rate of meningitis should be immunized. A first-year college student up through age 58 years who is living in a residence hall should receive a dose if she did not receive a dose on or after her 16th birthday. Adults who have certain high-risk conditions should receive one or more doses of vaccine.  Hepatitis A vaccine. Adults who wish to be protected from this disease, have certain high-risk conditions, work with hepatitis A-infected animals, work in hepatitis A research labs, or travel to or work in countries with a high rate of hepatitis A should be  immunized. Adults who were previously unvaccinated and who anticipate close contact with an international adoptee during the first 60 days after arrival in the [place] from a country with a high rate of hepatitis A should be immunized.  Hepatitis B vaccine.  Adults who wish to be protected from this disease, have certain high-risk conditions, may be exposed to blood or other infectious  body fluids, are household contacts or sex partners of hepatitis B positive people, are clients or workers in certain care facilities, or travel to or work in countries with a high rate of hepatitis B should be immunized.  Haemophilus influenzae type b (Hib) vaccine. A previously unvaccinated person with asplenia or sickle cell disease or having a scheduled splenectomy should receive 1 dose of Hib vaccine. Regardless of previous immunization, a recipient of a hematopoietic stem cell transplant should receive a 3-dose series 6 12 months after her successful transplant. Hib vaccine is not recommended for adults with HIV infection.  Preventive Services / Frequency Ages 43 to 39years  Blood pressure check.** / Every 1 to 2 years.  Lipid and cholesterol check.** / Every 5 years beginning at age 57.  Clinical breast exam.** / Every 3 years for women in their 44s and 51s.  BRCA-related cancer risk assessment.** / For women who have family members with a BRCA-related cancer (breast, ovarian, tubal, or peritoneal cancers).  Pap test.** / Every 2 years from ages 75 through 85. Every 3 years starting at age 62 through age 76 or 63 with a history of 3 consecutive normal Pap tests.  HPV screening.** / Every 3 years from ages 60 through ages 68 to 7 with a history of 3 consecutive normal Pap tests.  Hepatitis C blood test.** / For any individual with known risks for hepatitis C.  Skin self-exam. / Monthly.  Influenza vaccine. / Every year.  Tetanus, diphtheria, and acellular pertussis (Tdap, Td) vaccine.** / Consult your health care provider. Pregnant women should receive 1 dose of Tdap vaccine during each pregnancy. 1 dose of Td every 10 years.  Varicella vaccine.** / Consult your health care provider. Pregnant females who do not have evidence of immunity should receive the first dose after pregnancy.  HPV vaccine. / 3 doses over 6 months, if 22 and younger. The vaccine is not recommended for use in  pregnant females. However, pregnancy testing is not needed before receiving a dose.  Measles, mumps, rubella (MMR) vaccine.** / You need at least 1 dose of MMR if you were born in 1957 or later. You may also need a 2nd dose. For females of childbearing age, rubella immunity should be determined. If there is no evidence of immunity, females who are not pregnant should be vaccinated. If there is no evidence of immunity, females who are pregnant should delay immunization until after pregnancy.  Pneumococcal 13-valent conjugate (PCV13) vaccine.** / Consult your health care provider.  Pneumococcal polysaccharide (PPSV23) vaccine.** / 1 to 2 doses if you smoke cigarettes or if you have certain conditions.  Meningococcal vaccine.** / 1 dose if you are age 69 to 59 years and a Market researcher living in a residence hall, or have one of several medical conditions, you need to get vaccinated against meningococcal disease. You may also need additional booster doses.  Hepatitis A vaccine.** / Consult your health care provider.  Hepatitis B vaccine.** / Consult your health care provider.  Haemophilus influenzae type b (Hib) vaccine.** / Consult your health care provider.  Ages 74 to 58years  Blood pressure check.** / Every 1 to 2 years.  Lipid and cholesterol check.** / Every 5 years beginning at age 2 years.  Lung cancer screening. / Every year if you are aged 83 80 years and have a 30-pack-year history of smoking and currently smoke or have quit within the past 15 years. Yearly screening is stopped once you have quit smoking for at least 15 years or develop a health problem that would prevent you from having lung cancer treatment.  Clinical breast exam.** / Every year after age 17 years.  BRCA-related cancer risk assessment.** / For women who have family members with a BRCA-related cancer (breast, ovarian, tubal, or peritoneal cancers).  Mammogram.** / Every year beginning at age 84  years and continuing for as long as you are in good health. Consult with your health care provider.  Pap test.** / Every 3 years starting at age 77 years through age 30 or 10 years with a history of 3 consecutive normal Pap tests.  HPV screening.** / Every 3 years from ages 41 years through ages 34 to 92 years with a history of 3 consecutive normal Pap tests.  Fecal occult blood test (FOBT) of stool. / Every year beginning at age 20 years and continuing until age 58 years. You may not need to do this test if you get a colonoscopy every 10 years.  Flexible sigmoidoscopy or colonoscopy.** / Every 5 years for a flexible sigmoidoscopy or every 10 years for a colonoscopy beginning at age 15 years and continuing until age 27 years.  Hepatitis C blood test.** / For all people born from [birth year] through 1965 and any individual with known risks for hepatitis C.  Skin self-exam. / Monthly.  Influenza vaccine. / Every year.  Tetanus, diphtheria, and acellular pertussis (Tdap/Td) vaccine.** / Consult your health care provider. Pregnant women should receive 1 dose of Tdap vaccine during each pregnancy. 1 dose of Td every 10 years.  Varicella vaccine.** / Consult your health care provider. Pregnant females who do not have evidence of immunity should receive the first dose after pregnancy.  Zoster vaccine.** / 1 dose for adults aged 88 years or older.  Measles, mumps, rubella (MMR) vaccine.** / You need at least 1 dose of MMR if you were born in 1957 or later. You may also need a 2nd dose. For females of childbearing age, rubella immunity should be determined. If there is no evidence of immunity, females who are not pregnant should be vaccinated. If there is no evidence of immunity, females who are pregnant should delay immunization until after pregnancy.  Pneumococcal 13-valent conjugate (PCV13) vaccine.** / Consult your health care provider.  Pneumococcal polysaccharide (PPSV23) vaccine.** / 1 to 2 doses if  you smoke cigarettes or if you have certain conditions.  Meningococcal vaccine.** / Consult your health care provider.  Hepatitis A vaccine.** / Consult your health care provider.  Hepatitis B vaccine.** / Consult your health care provider.  Haemophilus influenzae type b (Hib) vaccine.** / Consult your health care provider.  Ages 56 years and over  Blood pressure check.** / Every 1 to 2 years.  Lipid and cholesterol check.** / Every 5 years beginning at age 58 years.  Lung cancer screening. / Every year if you are aged 49 80 years and have a 30-pack-year history of smoking and currently smoke or have quit within the past 15 years. Yearly screening is stopped once you have quit smoking for at least 15 years or develop a health problem that  would prevent you from having lung cancer treatment.  Clinical breast exam.** / Every year after age 80 years.  BRCA-related cancer risk assessment.** / For women who have family members with a BRCA-related cancer (breast, ovarian, tubal, or peritoneal cancers).  Mammogram.** / Every year beginning at age 62 years and continuing for as long as you are in good health. Consult with your health care provider.  Pap test.** / Every 3 years starting at age 56 years through age 67 or 47 years with 3 consecutive normal Pap tests. Testing can be stopped between 65 and 70 years with 3 consecutive normal Pap tests and no abnormal Pap or HPV tests in the past 10 years.  HPV screening.** / Every 3 years from ages 79 years through ages 62 or 76 years with a history of 3 consecutive normal Pap tests. Testing can be stopped between 65 and 70 years with 3 consecutive normal Pap tests and no abnormal Pap or HPV tests in the past 10 years.  Fecal occult blood test (FOBT) of stool. / Every year beginning at age 47 years and continuing until age 64 years. You may not need to do this test if you get a colonoscopy every 10 years.  Flexible sigmoidoscopy or colonoscopy.** /  Every 5 years for a flexible sigmoidoscopy or every 10 years for a colonoscopy beginning at age 41 years and continuing until age 3 years.  Hepatitis C blood test.** / For all people born from [birth year] through 1965 and any individual with known risks for hepatitis C.  Osteoporosis screening.** / A one-time screening for women ages 53 years and over and women at risk for fractures or osteoporosis.  Skin self-exam. / Monthly.  Influenza vaccine. / Every year.  Tetanus, diphtheria, and acellular pertussis (Tdap/Td) vaccine.** / 1 dose of Td every 10 years.  Varicella vaccine.** / Consult your health care provider.  Zoster vaccine.** / 1 dose for adults aged 63 years or older.  Pneumococcal 13-valent conjugate (PCV13) vaccine.** / Consult your health care provider.  Pneumococcal polysaccharide (PPSV23) vaccine.** / 1 dose for all adults aged 64 years and older.  Meningococcal vaccine.** / Consult your health care provider.  Hepatitis A vaccine.** / Consult your health care provider.  Hepatitis B vaccine.** / Consult your health care provider.  Haemophilus influenzae type b (Hib) vaccine.** / Consult your health care provider. ** Family history and personal history of risk and conditions may change your health care provider's recommendations. Document Released: 09/12/2001 Document Revised: 05/07/2013  [facility] Patient Information 2014 [place], [place].   EXERCISE AND DIET:  We recommended that you start or continue a regular exercise program for good health. Regular exercise means any activity that makes your heart beat faster and makes you sweat.  We recommend exercising at least 30 minutes per day at least 3 days a week, preferably 5.  We also recommend a diet low in fat and sugar / carbohydrates.  Inactivity, poor dietary choices and obesity can cause diabetes, heart attack, stroke, and kidney damage, among others.     ALCOHOL AND SMOKING:  Women should limit their alcohol intake to no  more than 7 drinks/beers/glasses of wine (combined, not each!) per week. Moderation of alcohol intake to this level decreases your risk of breast cancer and liver damage.  ( And of course, no recreational drugs are part of a healthy lifestyle.)  Also, you should not be smoking at all or even being exposed to second hand smoke. Most people know smoking can  cause cancer, and various heart and lung diseases, but did you know it also contributes to weakening of your bones?  Aging of your skin?  Yellowing of your teeth and nails?   CALCIUM AND VITAMIN D:  Adequate intake of calcium and Vitamin D are recommended.  The recommendations for exact amounts of these supplements seem to change often, but generally speaking 600 mg of calcium (either carbonate or citrate) and 800 units of Vitamin D per day seems prudent. Certain women may benefit from higher intake of Vitamin D.  If you are among these women, your doctor will have told you during your visit.     PAP SMEARS:  Pap smears, to check for cervical cancer or precancers,  have traditionally been done yearly, although recent scientific advances have shown that most women can have pap smears less often.  However, every woman still should have a physical exam from her gynecologist or primary care physician every year. It will include a breast check, inspection of the vulva and vagina to check for abnormal growths or skin changes, a visual exam of the cervix, and then an exam to evaluate the size and shape of the uterus and ovaries.  And after 63 years of age, a rectal exam is indicated to check for rectal cancers. We will also provide age appropriate advice regarding health maintenance, like when you should have certain vaccines, screening for sexually transmitted diseases, bone density testing, colonoscopy, mammograms, etc.    MAMMOGRAMS:  All women over 59 years old should have a yearly mammogram. Many facilities now offer a "3D" mammogram, which may cost  around $50 extra out of pocket. If possible,  we recommend you accept the option to have the 3D mammogram performed.  It both reduces the number of women who will be called back for extra views which then turn out to be normal, and it is better than the routine mammogram at detecting truly abnormal areas.     COLONOSCOPY:  Colonoscopy to screen for colon cancer is recommended for all women at age 62.  We know, you hate the idea of the prep.  We agree, BUT, having colon cancer and not knowing it is worse!!  Colon cancer so often starts as a polyp that can be seen and removed at colonscopy, which can quite literally save your life!  And if your first colonoscopy is normal and you have no family history of colon cancer, most women don't have to have it again for 10 years.  Once every ten years, you can do something that may end up saving your life, right?  We will be happy to help you get it scheduled when you are ready.  Be sure to check your insurance coverage so you understand how much it will cost.  It may be covered as a preventative service at no cost, but you should check your particular policy.    Blood pressure, weight, and majority of labs look very good. Elevations in total and LDL (bad) cholesterol- continue to be as active as you are and reduce saturated fat in your diet. Recommend re-checking cholesterol panel in 6 months- please make a fasting lab appt. Please complete Colorguard per instructions. Continue to social distance and wear a mask when in public. Continue annual physical with fasting labs. CONGRATULATIONS ON YOUR RETIREMENT!

## 2019-06-09 LAB — COLOGUARD: Cologuard: NEGATIVE

## 2019-06-19 ENCOUNTER — Telehealth: Payer: Self-pay

## 2019-06-19 NOTE — Telephone Encounter (Signed)
Pt informed of results.  Pt expressed understanding.  T. Deshunda Thackston, CMA  

## 2019-06-19 NOTE — Telephone Encounter (Signed)
LVM for pt to return call to inform her that Cologuard is negative.  Charyl Bigger, CMA

## 2019-11-13 ENCOUNTER — Other Ambulatory Visit: Payer: BC Managed Care – PPO

## 2019-12-19 ENCOUNTER — Ambulatory Visit: Payer: BC Managed Care – PPO | Attending: Internal Medicine

## 2019-12-19 DIAGNOSIS — Z23 Encounter for immunization: Secondary | ICD-10-CM

## 2019-12-19 NOTE — Progress Notes (Signed)
   Covid-19 Vaccination Clinic  Name:  Paula Hodges    MRN: [id number] DOB: January 11, 1956  12/19/2019  Paula Hodges was observed post Covid-19 immunization for 15 minutes without incident. She was provided with Vaccine Information Sheet and instruction to access the V-Safe system.   Paula Hodges was instructed to call 911 with any severe reactions post vaccine: Marland Kitchen Difficulty breathing  . Swelling of face and throat  . A fast heartbeat  . A bad rash all over body  . Dizziness and weakness   Immunizations Administered    Name Date Dose VIS Date Route   Pfizer COVID-19 Vaccine 12/19/2019 10:15 AM 0.3 mL 09/24/2018 Intramuscular   Manufacturer: [place]   Lot: [id number]   [place]: [id number]

## 2019-12-19 NOTE — Progress Notes (Signed)
   Covid-19 Vaccination Clinic  Name:  Paula Hodges    MRN: [id number] DOB: [birth year]  12/19/2019  Paula Hodges was observed post Covid-19 immunization for 15 minutes without incident. She was provided with Vaccine Information Sheet and instruction to access the V-Safe system.   Paula Hodges was instructed to call 911 with any severe reactions post vaccine: Marland Kitchen Difficulty breathing  . Swelling of face and throat  . A fast heartbeat  . A bad rash all over body  . Dizziness and weakness   Immunizations Administered    Name Date Dose VIS Date Route   Pfizer COVID-19 Vaccine 12/19/2019 10:15 AM 0.3 mL 09/24/2018 Intramuscular   Manufacturer: [place]   Lot: [id number]   [place]: [id number]

## 2020-01-13 ENCOUNTER — Ambulatory Visit: Payer: BC Managed Care – PPO

## 2020-05-17 ENCOUNTER — Encounter: Payer: BC Managed Care – PPO | Admitting: Physician Assistant

## 2020-07-05 ENCOUNTER — Other Ambulatory Visit: Payer: Self-pay | Admitting: Physician Assistant

## 2020-07-05 DIAGNOSIS — E559 Vitamin D deficiency, unspecified: Secondary | ICD-10-CM

## 2020-07-05 DIAGNOSIS — E7800 Pure hypercholesterolemia, unspecified: Secondary | ICD-10-CM

## 2020-07-05 DIAGNOSIS — Z0000 Encounter for general adult medical examination without abnormal findings: Secondary | ICD-10-CM

## 2020-07-09 ENCOUNTER — Other Ambulatory Visit: Payer: BC Managed Care – PPO

## 2020-07-09 ENCOUNTER — Other Ambulatory Visit: Payer: Self-pay

## 2020-07-09 DIAGNOSIS — E7800 Pure hypercholesterolemia, unspecified: Secondary | ICD-10-CM

## 2020-07-09 DIAGNOSIS — Z0000 Encounter for general adult medical examination without abnormal findings: Secondary | ICD-10-CM

## 2020-07-09 DIAGNOSIS — E559 Vitamin D deficiency, unspecified: Secondary | ICD-10-CM

## 2020-07-10 LAB — LIPID PANEL
Chol/HDL Ratio: 3.8 ratio (ref 0.0–4.4)
Cholesterol, Total: 226 mg/dL — ABNORMAL HIGH (ref 100–199)
HDL: 59 mg/dL (ref >39)
LDL Chol Calc (NIH): 153 mg/dL — ABNORMAL HIGH (ref 0–99)
Triglycerides: 80 mg/dL (ref 0–149)
VLDL Cholesterol Cal: 14 mg/dL (ref 5–40)

## 2020-07-10 LAB — COMPREHENSIVE METABOLIC PANEL
ALT: 14 IU/L (ref 0–32)
AST: 20 IU/L (ref 0–40)
Albumin/Globulin Ratio: 1.8 (ref 1.2–2.2)
Albumin: 4.7 g/dL (ref 3.8–4.8)
Alkaline Phosphatase: 75 IU/L (ref 44–121)
BUN/Creatinine Ratio: 12 (ref 12–28)
BUN: 10 mg/dL (ref 8–27)
Bilirubin Total: 0.6 mg/dL (ref 0.0–1.2)
CO2: 24 mmol/L (ref 20–29)
Calcium: 9.5 mg/dL (ref 8.7–10.3)
Chloride: 99 mmol/L (ref 96–106)
Creatinine, Ser: 0.82 mg/dL (ref 0.57–1.00)
GFR calc Af Amer: 87 mL/min/{1.73_m2} (ref >59)
GFR calc non Af Amer: 76 mL/min/{1.73_m2} (ref >59)
Globulin, Total: 2.6 g/dL (ref 1.5–4.5)
Glucose: 96 mg/dL (ref 65–99)
Potassium: 4.6 mmol/L (ref 3.5–5.2)
Sodium: 137 mmol/L (ref 134–144)
Total Protein: 7.3 g/dL (ref 6.0–8.5)

## 2020-07-10 LAB — CBC
Hematocrit: 42.5 % (ref 34.0–46.6)
Hemoglobin: 14.7 g/dL (ref 11.1–15.9)
MCH: 31 pg (ref 26.6–33.0)
MCHC: 34.6 g/dL (ref 31.5–35.7)
MCV: 90 fL (ref 79–97)
Platelets: 208 10*3/uL (ref 150–450)
RBC: 4.74 x10E6/uL (ref 3.77–5.28)
RDW: 11.4 % — ABNORMAL LOW (ref 11.7–15.4)
WBC: 5.4 10*3/uL (ref 3.4–10.8)

## 2020-07-10 LAB — TSH: TSH: 5.06 u[IU]/mL — ABNORMAL HIGH (ref 0.450–4.500)

## 2020-07-10 LAB — HEMOGLOBIN A1C
Est. average glucose Bld gHb Est-mCnc: 105 mg/dL
Hgb A1c MFr Bld: 5.3 % (ref 4.8–5.6)

## 2020-07-10 LAB — VITAMIN D 25 HYDROXY (VIT D DEFICIENCY, FRACTURES): Vit D, 25-Hydroxy: 32 ng/mL (ref 30.0–100.0)

## 2020-07-13 ENCOUNTER — Telehealth: Payer: Self-pay | Admitting: Physician Assistant

## 2020-07-13 NOTE — Telephone Encounter (Signed)
Labs have been added. AS, CMA 

## 2020-07-14 ENCOUNTER — Encounter: Payer: BC Managed Care – PPO | Admitting: Physician Assistant

## 2020-07-14 LAB — T3: T3, Total: 127 ng/dL (ref 71–180)

## 2020-07-14 LAB — T4, FREE: Free T4: 1.29 ng/dL (ref 0.82–1.77)

## 2020-07-14 LAB — SPECIMEN STATUS REPORT

## 2020-08-16 ENCOUNTER — Encounter: Payer: BC Managed Care – PPO | Admitting: Dermatology

## 2020-08-18 ENCOUNTER — Encounter: Payer: BC Managed Care – PPO | Admitting: Physician Assistant

## 2020-09-06 ENCOUNTER — Telehealth: Payer: Self-pay | Admitting: Physician Assistant

## 2020-09-06 NOTE — Telephone Encounter (Signed)
Patient called in to schedule a physical, there has been 3 cancellations due to COVID in the family, advised that this is her last reschedule- she will be discharged from practice per [facility] guidelines. she understoodPatient called in to schedule a physical, there has been 3 cancellations due to [place] in the family, advised that this is her last reschedule- she will be discharged from practice per [facility] guidelines. she understood

## 2020-09-08 ENCOUNTER — Other Ambulatory Visit: Payer: Self-pay

## 2020-09-08 ENCOUNTER — Ambulatory Visit: Payer: BC Managed Care – PPO | Admitting: Dermatology

## 2020-09-08 DIAGNOSIS — L578 Other skin changes due to chronic exposure to nonionizing radiation: Secondary | ICD-10-CM

## 2020-09-08 DIAGNOSIS — L814 Other melanin hyperpigmentation: Secondary | ICD-10-CM

## 2020-09-08 DIAGNOSIS — D229 Melanocytic nevi, unspecified: Secondary | ICD-10-CM | POA: Diagnosis not present

## 2020-09-08 DIAGNOSIS — L570 Actinic keratosis: Secondary | ICD-10-CM | POA: Diagnosis not present

## 2020-09-08 DIAGNOSIS — D2372 Other benign neoplasm of skin of left lower limb, including hip: Secondary | ICD-10-CM

## 2020-09-08 DIAGNOSIS — Z1283 Encounter for screening for malignant neoplasm of skin: Secondary | ICD-10-CM

## 2020-09-08 DIAGNOSIS — D1800 Hemangioma unspecified site: Secondary | ICD-10-CM

## 2020-09-08 DIAGNOSIS — L820 Inflamed seborrheic keratosis: Secondary | ICD-10-CM

## 2020-09-08 DIAGNOSIS — I781 Nevus, non-neoplastic: Secondary | ICD-10-CM

## 2020-09-08 DIAGNOSIS — L821 Other seborrheic keratosis: Secondary | ICD-10-CM | POA: Diagnosis not present

## 2020-09-08 NOTE — Progress Notes (Signed)
New Patient Visit  Subjective  Paula Hodges is a 65 y.o. female who presents for the following: Annual Exam (Total body exam today. No hx of skin cancer or dysplastic nevi. Pt has places of concern on her face, neck, arms, and right leg. ). Some itch and get irritated.  Patient here for full body skin exam and skin cancer screening.   Objective  Well appearing patient in no apparent distress; mood and affect are within normal limits.  A full examination was performed including scalp, head, eyes, ears, nose, lips, neck, chest, axillae, abdomen, back, buttocks, bilateral upper extremities, bilateral lower extremities, hands, feet, fingers, toes, fingernails, and toenails. All findings within normal limits unless otherwise noted below.  Objective  Right Lower Leg: Stuck-on, waxy, tan-brown papule --Discussed benign etiology and prognosis.   Objective  Spinal Mid Back at braline x 1, left shoulder x 1, left upper back x 1, inferior neck x 1, right chest x 1, left forehead x 1 (6): Erythematous keratotic or waxy stuck-on papule or plaque.   Objective  right malar cheek x 2, left malar cheek x 1 (3): Erythematous thin papules/macules with gritty scale.   Assessment & Plan  Seborrheic keratosis Right Lower Leg  Reassured benign age-related growth.  Recommend observation.  Discussed cryotherapy if spot(s) become irritated or inflamed.   Inflamed seborrheic keratosis (6) Spinal Mid Back at braline x 1, left shoulder x 1, left upper back x 1, inferior neck x 1, right chest x 1, left forehead x 1  Prior to procedure, discussed risks of blister formation, small wound, skin dyspigmentation, or rare scar following cryotherapy.    Destruction of lesion - Spinal Mid Back at braline x 1, left shoulder x 1, left upper back x 1, inferior neck x 1, right chest x 1, left forehead x 1  Destruction method: cryotherapy   Informed consent: discussed and consent obtained   Lesion destroyed  using liquid nitrogen: Yes   Region frozen until ice ball extended beyond lesion: Yes   Outcome: patient tolerated procedure well with no complications   Post-procedure details: wound care instructions given    AK (actinic keratosis) (3) right malar cheek x 2, left malar cheek x 1  Prior to procedure, discussed risks of blister formation, small wound, skin dyspigmentation, or rare scar following cryotherapy.    Destruction of lesion - right malar cheek x 2, left malar cheek x 1  Destruction method: cryotherapy   Informed consent: discussed and consent obtained   Lesion destroyed using liquid nitrogen: Yes   Region frozen until ice ball extended beyond lesion: Yes   Outcome: patient tolerated procedure well with no complications   Post-procedure details: wound care instructions given     Lentigines - Scattered tan macules - Discussed due to sun exposure - Benign, observe - Call for any changes  Seborrheic Keratoses - Stuck-on, waxy, tan-brown papules and plaques  - Discussed benign etiology and prognosis. - Observe - Call for any changes  Melanocytic Nevi - Tan-brown and/or pink-flesh-colored symmetric macules and papules - Benign appearing on exam today - Observation - Call clinic for new or changing moles - Recommend daily use of broad spectrum spf 30+ sunscreen to sun-exposed areas.   Hemangiomas - Red papules - Discussed benign nature - Observe - Call for any changes  Actinic Damage - Chronic, secondary to cumulative UV/sun exposure - diffuse scaly erythematous macules with underlying dyspigmentation - Recommend daily broad spectrum sunscreen SPF 30+ to sun-exposed  areas, reapply every 2 hours as needed.  - Call for new or changing lesions.  Telangiectasia - Dilated blood vessel - Benign appearing on exam - Call for changes  Dermatofibroma Left thigh - Firm pink/brown papulenodule with dimple sign - Benign appearing - Call for any changes   Skin  cancer screening performed today.  Return in about 1 year (around 09/08/2021) for TBSE, call if needed sooner for AKs.   I, Harriett Sine, CMA, am acting as scribe for Brendolyn Patty, MD.  Documentation: I have reviewed the above documentation for accuracy and completeness, and I agree with the above.  Brendolyn Patty MD

## 2020-09-08 NOTE — Patient Instructions (Addendum)
Cryotherapy Aftercare  . Wash gently with soap and water everyday.   Marland Kitchen Apply Vaseline and Band-Aid daily until healed.    Seborrheic Keratosis  What causes seborrheic keratoses? Seborrheic keratoses are harmless, common skin growths that first appear during adult life.  As time goes by, more growths appear.  Some people may develop a large number of them.  Seborrheic keratoses appear on both covered and uncovered body parts.  They are not caused by sunlight.  The tendency to develop seborrheic keratoses can be inherited.  They vary in color from skin-colored to gray, brown, or even black.  They can be either smooth or have a rough, warty surface.   Seborrheic keratoses are superficial and look as if they were stuck on the skin.  Under the microscope this type of keratosis looks like layers upon layers of skin.  That is why at times the top layer may seem to fall off, but the rest of the growth remains and re-grows.    Treatment Seborrheic keratoses do not need to be treated, but can easily be removed in the office.  Seborrheic keratoses often cause symptoms when they rub on clothing or jewelry.  Lesions can be in the way of shaving.  If they become inflamed, they can cause itching, soreness, or burning.  Removal of a seborrheic keratosis can be accomplished by freezing, burning, or surgery. If any spot bleeds, scabs, or grows rapidly, please return to have it checked, as these can be an indication of a skin cancer.     Actinic Keratosis  What is an actinic keratosis? An actinic keratosis (plural: actinic keratoses) is growth on the surface of the skin that usually appears as a red, hard, crusty or scaly bump.  What causes actinic keratoses? Repeated prolonged sun exposure causes skin damage, especially in fair-skinned persons. Sun-damaged skin becomes dry and wrinkled and may form rough, scaly spots called actinic keratoses. These rough spots remain on the skin even though the crust or  scale on top is picked off.  Why treat actinic keratoses? Actinic keratoses are not skin cancers, but because they may sometimes turn cancerous they are called "pre-cancerous". Not all will turn to skin cancer, and it usually takes several years for this to happen. Because it is much easier to treat an actinic keratosis then it is to remove a skin cancer, actinic keratoses should be treated to prevent future skin cancer.  How are actinic keratoses treated? The most common way of treating actinic keratoses is to freeze them with liquid nitrogen. Freezing causes scabbing and shedding of the sun-damaged skin. Healing after a removal usually takes two weeks, depending on the size and location of the keratosis. Hands and legs heal more slowly than the face. The skin's final appearance is usually excellent. There are several topical medications that can be used to treat actinic keratoses. These medications generally have side effects of redness, crusting, and pain.Some are used for a few days, and some for several months before the actinic keratosis is completely gone. Photodynamic therapy is another alternative to freezing actinic keratoses.This treatment is done in a physician's office.A medication is applied to the area of skin with actinic keratoses, and it is allowed to soak in for one or more hours. A special light is then applied to the skin.Side effects include redness, burning, and peeling.  How can you prevent actinic keratoses? Protection from the sun is the best way to prevent actinic keratoses.The use of proper clothing and sunscreens can  prevent the sun damage that leads to an actinic keratosis. Unfortunately, some sun damage is permanent. Once sun damage has progressed to the point where actinic keratoses develop, new keratoses may appear even without further sun exposure. However, even in skin that is already heavily sun damaged, good sun protection can help reduce the number of actinic  keratoses that will appear.        Melanoma ABCDEs  Melanoma is the most dangerous type of skin cancer, and is the leading cause of death from skin disease.  You are more likely to develop melanoma if you:  Have light-colored skin, light-colored eyes, or red or blond hair  Spend a lot of time in the sun  Tan regularly, either outdoors or in a tanning bed  Have had blistering sunburns, especially during childhood  Have a close family member who has had a melanoma  Have atypical moles or large birthmarks  Early detection of melanoma is key since treatment is typically straightforward and cure rates are extremely high if we catch it early.   The first sign of melanoma is often a change in a mole or a new dark spot.  The ABCDE system is a way of remembering the signs of melanoma.  A for asymmetry:  The two halves do not match. B for border:  The edges of the growth are irregular. C for color:  A mixture of colors are present instead of an even brown color. D for diameter:  Melanomas are usually (but not always) greater than 28mm - the size of a pencil eraser. E for evolution:  The spot keeps changing in size, shape, and color.  Please check your skin once per month between visits. You can use a small mirror in front and a large mirror behind you to keep an eye on the back side or your body.   If you see any new or changing lesions before your next follow-up, please call to schedule a visit.  Please continue daily skin protection including broad spectrum sunscreen SPF 30+ to sun-exposed areas, reapplying every 2 hours as needed when you're outdoors.

## 2020-10-07 ENCOUNTER — Other Ambulatory Visit: Payer: BC Managed Care – PPO

## 2020-10-11 ENCOUNTER — Ambulatory Visit (INDEPENDENT_AMBULATORY_CARE_PROVIDER_SITE_OTHER): Payer: BC Managed Care – PPO | Admitting: Physician Assistant

## 2020-10-11 ENCOUNTER — Other Ambulatory Visit: Payer: Self-pay

## 2020-10-11 ENCOUNTER — Encounter: Payer: Self-pay | Admitting: Physician Assistant

## 2020-10-11 VITALS — BP 109/53 | HR 72 | Temp 98.5°F | Ht 65.0 in | Wt 151.9 lb

## 2020-10-11 DIAGNOSIS — E038 Other specified hypothyroidism: Secondary | ICD-10-CM | POA: Diagnosis not present

## 2020-10-11 DIAGNOSIS — E7800 Pure hypercholesterolemia, unspecified: Secondary | ICD-10-CM

## 2020-10-11 DIAGNOSIS — Z5320 Procedure and treatment not carried out because of patient's decision for unspecified reasons: Secondary | ICD-10-CM

## 2020-10-11 DIAGNOSIS — Z0000 Encounter for general adult medical examination without abnormal findings: Secondary | ICD-10-CM

## 2020-10-11 NOTE — Progress Notes (Signed)
Female Physical   Impression and Recommendations:    1. Healthcare maintenance   2. Elevated LDL cholesterol level   3. Mammogram declined   4. Colon cancer screening declined   5. Screening for hepatitis C declined   6. HIV screening declined   7. Pap smear of cervix declined      1) Anticipatory Guidance: Skin CA prevention- recommend to use sunscreen when outside along with skin surveillance; eating a balanced and modest diet; physical activity at least 25 minutes per day or minimum of 150 min/ week moderate to intense activity.  2) Immunizations / Screenings / Labs:   All immunizations are up-to-date per recommendations or will be updated today if pt allows.    - Patient understands with dental and vision screens they will schedule independently.  - Obtained CBC, CMP, HgA1c, Lipid panel, TSH and vit D when fasting, if not already done past 12 mo/ recently. Most labs are essentially within normal limits or stable from prior. The 10-year ASCVD risk score Mikey Bussing DC Jr., et al., 2013) is: 3.8% Patient prefers to continue managing HLD with diet and lifestyle changes. Recommend to consider statin therapy if LDL fails to improve or worsen. Will repeat in 6 months. -Declined mammogram, pap smear, colonoscopy, Shingrix and Hep C and HIV screenings.   3) Weight: Recommend to improve diet habits to improve overall feelings of well being and objective health data. Improve nutrient density of diet through increasing intake of fruits and vegetables and decreasing saturated fats, white flour products and refined sugars.  4) Healthcare Maintenance: -Recommend to reduce saturated and trans fats, simple carbohydrates and increase physical activity.  -Stay well hydrated. -Vital signs wnl's. -Recommend to start taking Vit D3 2000 units daily.  -Subclinical hypothyroidism, patient is asymptomatic. Will continue to monitor. -Follow up in 1 year for CPE and FBW. Lab visit in 6 months to repeat  lipid panel.    No orders of the defined types were placed in this encounter.   No orders of the defined types were placed in this encounter.    Return in about 1 year (around 10/11/2021) for CPE and FBW; lab visit in 6 months to repeat lipid panel (fasting).     Gross side effects, risk and benefits, and alternatives of medications discussed with patient.  Patient is aware that all medications have potential side effects and we are unable to predict every side effect or drug-drug interaction that may occur.  Expresses verbal understanding and consents to current therapy plan and treatment regimen.  F-up preventative CPE in 1 year-  this is in addition to any chronic care visits.    Please see orders placed and AVS handed out to patient at the end of our visit for further patient instructions/ counseling done pertaining to today's office visit.   Subjective:     CPE HPI: Paula Hodges is a 65 y.o. female who presents [facility] today for a yearly health maintenance exam.   Health Maintenance Summary  - Reviewed and updated, unless pt declines services.  Last Cologuard or Colonoscopy:   Declined screening colonoscopy. Reports remote history of prior colonoscopy >10 yrs ago, unable to recall further details. Family history of Colon CA:  N Tobacco History Reviewed:  Y, former smoker CT scan for screening lung CA:  N/A Alcohol and/or drug use:   No concerns; no use Eye exams:Y Female Health:  PAP Smear - last known results:  no  h/o ABN, unable to recall last pap. Declined today. STD concerns:   none, monogamous Birth control method:  Postmenopausal  Menses regular:  Postmenopausal  Lumps or breast concerns:  none Breast Cancer Family History:  No   Additional concerns beyond health maintenance issues: none    Immunization History  Administered Date(s) Administered  . Influenza,inj,Quad PF,6+ Mos 05/15/2019  . Influenza-Unspecified  05/30/2015  . PFIZER(Purple Top)SARS-COV-2 Vaccination 12/19/2019  . Tdap 11/15/2011     Health Maintenance  Topic Date Due  . Hepatitis C Screening  Never done  . HIV Screening  Never done  . PAP SMEAR-Modifier  06/05/2016  . MAMMOGRAM  07/17/2019  . COVID-19 Vaccine (2 - Pfizer risk 4-dose series) 01/09/2020  . INFLUENZA VACCINE  02/29/2020  . TETANUS/TDAP  11/14/2021  . Fecal DNA (Cologuard)  06/08/2022  . HPV VACCINES  Aged Out     Wt Readings from Last 3 Encounters:  10/11/20 151 lb 14.4 oz (68.9 kg)  05/15/19 144 lb 3.2 oz (65.4 kg)  03/12/19 146 lb 3.2 oz (66.3 kg)   BP Readings from Last 3 Encounters:  10/11/20 (!) 109/53  05/15/19 119/70  03/12/19 136/72   Pulse Readings from Last 3 Encounters:  10/11/20 72  05/15/19 65  03/12/19 76     Past Medical History:  Diagnosis Date  . Hyperlipidemia       Past Surgical History:  Procedure Laterality Date  . TONSILLECTOMY        Family History  Problem Relation Age of Onset  . Lymphoma Mother   . Lung cancer Father   . Liver cancer Father   . Hypertension Father   . Hyperlipidemia Father   . Obesity Sister   . Hypertension Brother   . Hyperlipidemia Brother   . Diabetes Paternal Uncle       Social History   Substance and Sexual Activity  Drug Use No  ,   Social History   Substance and Sexual Activity  Alcohol Use Not Currently  . Alcohol/week: 0.0 standard drinks  ,   Social History   Tobacco Use  Smoking Status Former Smoker  . Years: 10.00  . Quit date: 08/01/1979  . Years since quitting: 41.2  Smokeless Tobacco Never Used  ,   Social History   Substance and Sexual Activity  Sexual Activity Yes  . Birth control/protection: None    Current Outpatient Medications on File Prior to Visit  Medication Sig Dispense Refill  . MULTIPLE VITAMIN PO Take by mouth.    . TURMERIC PO Take by mouth daily.     No current facility-administered medications on file prior to visit.     Allergies: Patient has no known allergies.  Review of Systems: General:   Denies fever, chills, unexplained weight loss.  Optho/Auditory:   Denies visual changes, blurred vision/LOV Respiratory:   Denies SOB, DOE more than baseline levels.   Cardiovascular:   Denies chest pain, palpitations, new onset peripheral edema  Gastrointestinal:   Denies nausea, vomiting, diarrhea.  Genitourinary: Denies dysuria, freq/ urgency, flank pain or discharge from genitals.  Endocrine:     Denies hot or cold intolerance, polyuria, polydipsia. Musculoskeletal:   Denies unexplained myalgias, joint swelling, unexplained arthralgias, gait problems.  Skin:  Denies rash, suspicious lesions Neurological:     Denies dizziness, unexplained weakness, numbness  Psychiatric/Behavioral:   Denies mood changes, suicidal or homicidal ideations, hallucinations    Objective:    Blood pressure (!) 109/53, pulse 72, temperature 98.5 F (36.9 C),  height 5\' 5"  (1.651 m), weight 151 lb 14.4 oz (68.9 kg), SpO2 100 %. Body mass index is 25.28 kg/m. General Appearance:    Alert, cooperative, no distress, appears stated age  Head:    Normocephalic, without obvious abnormality, atraumatic  Eyes:    PERRL, conjunctiva/corneas clear, EOM's intact, both eyes  Ears:    Normal TM's and external ear canals, both ears  Nose:   Nares normal, septum midline, mucosa normal, no drainage    or sinus tenderness  Throat:   Lips w/o lesion, mucosa moist, and tongue normal; teeth and   gums normal  Neck:   Supple, symmetrical, trachea midline, no adenopathy;    thyroid:  no enlargement/tenderness/nodules; no JVD  Back:     Symmetric, no curvature, ROM normal, no CVA tenderness  Lungs:     Clear to auscultation bilaterally, respirations unlabored, no       Wh/ R/ R  Chest Wall:    No tenderness or gross deformity; normal excursion   Heart:    Regular rate and rhythm, S1 and S2 normal, no murmur, rub   or gallop  Breast Exam:     Deferred by pt.  Abdomen:     Soft, non-tender, bowel sounds active all four quadrants, No   G/R/R, no masses, no organomegaly  Genitalia:    Deferred by pt.  Rectal:    Deferred by pt.  Extremities:   Extremities normal, atraumatic, no cyanosis or gross edema  Pulses:   2+ and symmetric all extremities  Skin:   Warm, dry, Skin color, texture, turgor normal, no obvious rashes or lesions, varicose vein of left lower extremity noted Psych: No HI/SI, judgement and insight good, Euthymic mood. Full Affect.  Neurologic:   CNII-XII intact, normal strength, sensation and reflexes    Throughout

## 2020-10-11 NOTE — Patient Instructions (Signed)

## 2021-03-24 ENCOUNTER — Other Ambulatory Visit: Payer: Self-pay

## 2021-03-24 ENCOUNTER — Telehealth: Payer: Self-pay | Admitting: Physician Assistant

## 2021-03-24 ENCOUNTER — Ambulatory Visit (INDEPENDENT_AMBULATORY_CARE_PROVIDER_SITE_OTHER): Payer: Medicare PPO | Admitting: Physician Assistant

## 2021-03-24 ENCOUNTER — Encounter: Payer: Self-pay | Admitting: Physician Assistant

## 2021-03-24 DIAGNOSIS — R21 Rash and other nonspecific skin eruption: Secondary | ICD-10-CM

## 2021-03-24 MED ORDER — VALACYCLOVIR HCL 1 G PO TABS: 1000.0000 mg | ORAL_TABLET | Freq: Three times a day (TID) | ORAL | 0 refills | Status: AC

## 2021-03-24 NOTE — Progress Notes (Signed)
Telehealth office visit note for Paula Reid, PA-C- at [facility]   I connected with current patient today by telephone and verified that I am speaking with the correct person    Location of the patient: Home  Location of the provider: Office - This visit type was conducted due to national recommendations for restrictions regarding the COVID-19 Pandemic (e.g. social distancing) in an effort to limit this patient's exposure and mitigate transmission in our community.    - No physical exam could be performed with this format, beyond that communicated to [place] by the patient/ family members as noted.   - Additionally my office staff/ schedulers were to discuss with the patient that there may be a monetary charge related to this service, depending on their medical insurance.  My understanding is that patient understood and consented to proceed.     _________________________________________________________________________________   History of Present Illness: Patient calls in with c/o rash on left side of chest, under axilla and back. Reports started having pain Saturday (5 days ago) and the rash appeared Monday night. Feels tired. Has not received Shingles vaccine, no prior hx of shingles. Denies fever, recent travel or known exposure to someone else with a rash.      No flowsheet data found.  Depression screen [facility] 2/9 10/11/2020 05/15/2019 03/12/2019 12/14/2017 12/12/2016  Decreased Interest 0 0 0 0 0  Down, Depressed, Hopeless 0 0 0 0 0  PHQ - 2 Score 0 0 0 0 0  Altered sleeping 1 0 0 - -  Tired, decreased energy 0 0 0 - -  Change in appetite 0 0 0 - -  Feeling bad or failure about yourself  0 0 0 - -  Trouble concentrating 0 0 0 - -  Moving slowly or fidgety/restless 0 0 0 - -  Suicidal thoughts 0 0 0 - -  PHQ-9 Score 1 0 0 - -      Impression and Recommendations:     1. Rash and nonspecific skin eruption     Rash and nonspecific skin  eruption: -Discussed with patient obtaining swab to r/o monkeypox infection given rash appearance and symptoms can be similar to herpes zoster. During swab collection rash appearance was clusters of vesicles along thoracic dermatome on left side. Will start oral antiviral medication (Valtrex 1000 mg every 8 hours for 7 days) for possible shingles. Patient will call back with which pharmacy to send rx.    - As part of my medical decision making, I reviewed the following data within the [place] History obtained from pt /family, CMA notes reviewed and incorporated if applicable, Labs reviewed, Radiograph/ tests reviewed if applicable and OV notes from prior OV's with me, as well as any other specialists she/he has seen since seeing me last, were all reviewed and used in my medical decision making process today.    - Additionally, when appropriate, discussion had with patient regarding our treatment plan, and their biases/concerns about that plan were used in my medical decision making today.    - The patient agreed with the plan and demonstrated an understanding of the instructions.   No barriers to understanding were identified.     - The patient was advised to call back or seek an in-person evaluation if the symptoms worsen or if the condition fails to improve as anticipated.   Return if symptoms worsen or fail to improve.    Orders Placed This  Encounter  Procedures   Monkeypox Virus DNA, Qualitative Real-Time PCR    No orders of the defined types were placed in this encounter.   There are no discontinued medications.     Time spent on telephone encounter was 6 minutes.      The [place] was signed into law in 2016 which includes the topic of electronic health records.  This provides immediate access to information in MyChart.  This includes consultation notes, operative notes, office notes, lab results and pathology reports.  If you have any  questions about what you read please let us know at your next visit or call us at the office.  We are right here with you.   __________________________________________________________________________________     Patient Care Team    Relationship Specialty Notifications Start End  Paula Hodges, [place] PCP - General Physician Assistant  07/05/20   Lucilla Lame, MD Consulting Physician Gastroenterology  03/18/19      -Vitals obtained; medications/ allergies reconciled;  personal medical, social, Sx etc.histories were updated by CMA, reviewed by me and are reflected in chart   Patient Active Problem List   Diagnosis Date Noted   Elevated LDL cholesterol level 05/15/2019   Healthcare maintenance 03/12/2019   Hypercholesterolemia without hypertriglyceridemia 05/26/2015   Vitamin D deficiency 05/26/2015     No outpatient medications have been marked as taking for the 03/24/21 encounter (Office Visit) with Paula Reid, PA-C.     Allergies:  No Known Allergies   ROS:  See above HPI for pertinent positives and negatives   Objective:   There were no vitals taken for this visit.  (if some vitals are omitted, this means that patient was UNABLE to obtain them even.) General: A & O * 3; sounds in no acute distress; in usual state of health.  Respiratory: speaking in full sentences, no conversational dyspnea Psych: insight appears good, mood- appears full

## 2021-03-24 NOTE — Telephone Encounter (Signed)
Called patient to notify medication has been called in at the [facility] in [place]. She is aware that she will not be able to get the shingles vaccine for a few months after the infection is cleared up.

## 2021-03-24 NOTE — Telephone Encounter (Signed)
Please call patient when the order has been sent to [facility] so patient is aware.  She would like to get the vaccine today.

## 2021-03-24 NOTE — Telephone Encounter (Signed)
Patient states the pharmacy for her shingles med is [facility] in [place] and the number [phone]. Please advise, thanks.

## 2021-03-26 LAB — SPECIMEN STATUS REPORT

## 2021-03-28 LAB — MONKEYPOX VIRUS DNA, QUALITATIVE REAL-TIME PCR: Orthopoxvirus DNA, QL PCR: NOT DETECTED

## 2021-04-15 ENCOUNTER — Other Ambulatory Visit: Payer: BC Managed Care – PPO

## 2021-08-12 ENCOUNTER — Other Ambulatory Visit: Payer: Self-pay

## 2021-08-12 ENCOUNTER — Ambulatory Visit: Payer: Medicare PPO | Admitting: Nurse Practitioner

## 2021-08-12 ENCOUNTER — Encounter: Payer: Self-pay | Admitting: Nurse Practitioner

## 2021-08-12 VITALS — BP 137/68 | HR 76 | Temp 98.1°F | Ht 65.0 in | Wt 147.9 lb

## 2021-08-12 DIAGNOSIS — N390 Urinary tract infection, site not specified: Secondary | ICD-10-CM

## 2021-08-12 DIAGNOSIS — R319 Hematuria, unspecified: Secondary | ICD-10-CM | POA: Diagnosis not present

## 2021-08-12 LAB — POCT URINALYSIS DIP (CLINITEK)
Bilirubin, UA: NEGATIVE
Glucose, UA: NEGATIVE mg/dL
Ketones, POC UA: NEGATIVE mg/dL
Nitrite, UA: NEGATIVE
Spec Grav, UA: 1.01 (ref 1.010–1.025)
Urobilinogen, UA: 0.2 E.U./dL
pH, UA: 6.5 (ref 5.0–8.0)

## 2021-08-12 MED ORDER — NITROFURANTOIN MONOHYD MACRO 100 MG PO CAPS
100.0000 mg | ORAL_CAPSULE | Freq: Two times a day (BID) | ORAL | 0 refills | Status: DC
Start: 2021-08-12 — End: 2021-08-24

## 2021-08-12 NOTE — Patient Instructions (Signed)
I have started macrobid 100mg  twice daily for 5 days.  I recommend over the counter AZO to use up to three times daily as needed for bladder pain/spasms.

## 2021-08-12 NOTE — Progress Notes (Signed)
Acute Office Visit  Subjective:    Patient ID: Paula Hodges, female    DOB: 06/15/1956, 66 y.o.   MRN: [id number]  Chief Complaint  Patient presents with   Urinary Tract Infection    Urinary Tract Infection  This is a new problem. The current episode started in the past 7 days. The problem occurs intermittently. The problem has been waxing and waning. The quality of the pain is described as burning. Maximum temperature: may have had low grade fever a few nights ago. She is Sexually active (no change in intimate partner). Associated symptoms include chills, frequency, hematuria, hesitancy, sweats and urgency. Pertinent negatives include no discharge, flank pain, nausea or vomiting. She has tried nothing for the symptoms.   Past Medical History:  Diagnosis Date   Hyperlipidemia     Past Surgical History:  Procedure Laterality Date   TONSILLECTOMY      Family History  Problem Relation Age of Onset   Lymphoma Mother    Lung cancer Father    Liver cancer Father    Hypertension Father    Hyperlipidemia Father    Obesity Sister    Hypertension Brother    Hyperlipidemia Brother    Diabetes Paternal Uncle     Social History   Socioeconomic History   Marital status: Married    Spouse name: Not on file   Number of children: Not on file   Years of education: Not on file   Highest education level: Not on file  Occupational History   Not on file  Tobacco Use   Smoking status: Former    Years: 10.00    Types: Cigarettes    Quit date: 08/01/1979    Years since quitting: 42.0   Smokeless tobacco: Never  Vaping Use   Vaping Use: Never used  Substance and Sexual Activity   Alcohol use: Not Currently    Alcohol/week: 0.0 standard drinks   Drug use: No   Sexual activity: Yes    Birth control/protection: None  Other Topics Concern   Not on file  Social History Narrative   Not on file   Social Determinants of Health   Financial Resource Strain: Not on file  Food  Insecurity: Not on file  Transportation Needs: Not on file  Physical Activity: Not on file  Stress: Not on file  Social Connections: Not on file  Intimate Partner Violence: Not on file    Outpatient Medications Prior to Visit  Medication Sig Dispense Refill   MULTIPLE VITAMIN PO Take by mouth.     TURMERIC PO Take by mouth daily.     No facility-administered medications prior to visit.    No Known Allergies  Review of Systems  Constitutional:  Positive for chills and fatigue. Negative for activity change, appetite change and fever.  HENT:  Negative for congestion, postnasal drip, rhinorrhea, sinus pressure, sinus pain, sneezing and sore throat.   Eyes: Negative.   Respiratory:  Negative for cough, chest tightness, shortness of breath and wheezing.   Cardiovascular:  Negative for chest pain and palpitations.       Blood pressur mildly elevated today   Gastrointestinal:  Negative for abdominal pain, constipation, diarrhea, nausea and vomiting.  Endocrine: Negative for cold intolerance, heat intolerance, polydipsia and polyuria.  Genitourinary:  Positive for dysuria, frequency, hematuria, hesitancy and urgency. Negative for dyspareunia and flank pain.  Musculoskeletal:  Negative for arthralgias, back pain and myalgias.  Skin:  Negative for rash.  Allergic/Immunologic: Negative for environmental  allergies.  Neurological:  Negative for dizziness, weakness and headaches.  Hematological:  Negative for adenopathy.  Psychiatric/Behavioral:  The patient is not nervous/anxious.       Objective:    Physical Exam Vitals and nursing note reviewed.  Constitutional:      Appearance: Normal appearance. She is well-developed.  HENT:     Head: Normocephalic.  Eyes:     Pupils: Pupils are equal, round, and reactive to light.  Cardiovascular:     Rate and Rhythm: Normal rate and regular rhythm.     Pulses: Normal pulses.     Heart sounds: Normal heart sounds.  Pulmonary:     Effort:  Pulmonary effort is normal.     Breath sounds: Normal breath sounds.  Abdominal:     Palpations: Abdomen is soft.  Genitourinary:    Comments: Urine sample positive for large number of WBC and moderate blood. It is negative for nitrites  Musculoskeletal:        General: Normal range of motion.     Cervical back: Normal range of motion and neck supple.  Lymphadenopathy:     Cervical: No cervical adenopathy.  Skin:    General: Skin is warm and dry.     Capillary Refill: Capillary refill takes less than 2 seconds.  Neurological:     General: No focal deficit present.     Mental Status: She is alert and oriented to person, place, and time.  Psychiatric:        Mood and Affect: Mood normal.        Behavior: Behavior normal.        Thought Content: Thought content normal.        Judgment: Judgment normal.   Today's Vitals   08/12/21 1058 08/12/21 1116  BP: (!) 148/68 137/68  Pulse: 76   Temp: 98.1 F (36.7 C)   SpO2: 99%   Weight: 147 lb 14.4 oz (67.1 kg)   Height: 5\' 5"  (1.651 m)    Body mass index is 24.61 kg/m.   Wt Readings from Last 3 Encounters:  08/12/21 147 lb 14.4 oz (67.1 kg)  10/11/20 151 lb 14.4 oz (68.9 kg)  05/15/19 144 lb 3.2 oz (65.4 kg)    Health Maintenance Due  Topic Date Due   Pneumonia Vaccine 63+ Years old (1 - PCV) Never done   HIV Screening  Never done   Hepatitis C Screening  Never done   Zoster Vaccines- Shingrix (1 of 2) Never done   PAP SMEAR-Modifier  06/05/2016   MAMMOGRAM  07/17/2019   COVID-19 Vaccine (2 - Pfizer risk series) 01/09/2020   DEXA SCAN  01/23/2021   INFLUENZA VACCINE  02/28/2021    There are no preventive care reminders to display for this patient.   Lab Results  Component Value Date   TSH 5.060 (H) 07/09/2020   Lab Results  Component Value Date   WBC 5.4 07/09/2020   HGB 14.7 07/09/2020   HCT 42.5 07/09/2020   MCV 90 07/09/2020   PLT 208 07/09/2020   Lab Results  Component Value Date   NA 137 07/09/2020    K 4.6 07/09/2020   CO2 24 07/09/2020   GLUCOSE 96 07/09/2020   BUN 10 07/09/2020   CREATININE 0.82 07/09/2020   BILITOT 0.6 07/09/2020   ALKPHOS 75 07/09/2020   AST 20 07/09/2020   ALT 14 07/09/2020   PROT 7.3 07/09/2020   ALBUMIN 4.7 07/09/2020   CALCIUM 9.5 07/09/2020   Lab Results  Component Value Date   CHOL 226 (H) 07/09/2020   Lab Results  Component Value Date   HDL 59 07/09/2020   Lab Results  Component Value Date   LDLCALC 153 (H) 07/09/2020   Lab Results  Component Value Date   TRIG 80 07/09/2020   Lab Results  Component Value Date   CHOLHDL 3.8 07/09/2020   Lab Results  Component Value Date   HGBA1C 5.3 07/09/2020       Assessment & Plan:  1. Urinary tract infection with hematuria, site unspecified Urine sample positive for large white blood cells and moderate blood.  Start Macrobid.  Take twice daily for 5 days.  We will send urine sample for culture and sensitivity and adjust antibiotic dosing as indicated. - POCT URINALYSIS DIP (CLINITEK) - Urine Culture; Future - nitrofurantoin, macrocrystal-monohydrate, (MACROBID) 100 MG capsule; Take 1 capsule (100 mg total) by mouth 2 (two) times daily.  Dispense: 10 capsule; Refill: 0 - Urine Culture    Problem List Items Addressed This Visit       Genitourinary   Urinary tract infection with hematuria - Primary   Relevant Medications   nitrofurantoin, macrocrystal-monohydrate, (MACROBID) 100 MG capsule   Other Relevant Orders   POCT URINALYSIS DIP (CLINITEK)   Urine Culture     Meds ordered this encounter  Medications   nitrofurantoin, macrocrystal-monohydrate, (MACROBID) 100 MG capsule    Sig: Take 1 capsule (100 mg total) by mouth 2 (two) times daily.    Dispense:  10 capsule    Refill:  0    Order Specific Question:   Supervising Provider    Answer:   Beatrice Lecher D [[id number]     Ronnell Freshwater, NP

## 2021-08-15 ENCOUNTER — Other Ambulatory Visit: Payer: Self-pay

## 2021-08-15 DIAGNOSIS — Z0000 Encounter for general adult medical examination without abnormal findings: Secondary | ICD-10-CM

## 2021-08-16 LAB — URINE CULTURE

## 2021-08-16 NOTE — Progress Notes (Signed)
Patient started on Macrobid. Continue to wait for sensitivity results.

## 2021-08-17 ENCOUNTER — Other Ambulatory Visit: Payer: Medicare PPO

## 2021-08-17 ENCOUNTER — Other Ambulatory Visit: Payer: Self-pay

## 2021-08-17 DIAGNOSIS — Z0000 Encounter for general adult medical examination without abnormal findings: Secondary | ICD-10-CM

## 2021-08-18 LAB — CBC
Hematocrit: 39.1 % (ref 34.0–46.6)
Hemoglobin: 13.7 g/dL (ref 11.1–15.9)
MCH: 30.7 pg (ref 26.6–33.0)
MCHC: 35 g/dL (ref 31.5–35.7)
MCV: 88 fL (ref 79–97)
Platelets: 240 10*3/uL (ref 150–450)
RBC: 4.46 x10E6/uL (ref 3.77–5.28)
RDW: 11.8 % (ref 11.7–15.4)
WBC: 4.9 10*3/uL (ref 3.4–10.8)

## 2021-08-18 LAB — COMPREHENSIVE METABOLIC PANEL
ALT: 10 IU/L (ref 0–32)
AST: 14 IU/L (ref 0–40)
Albumin/Globulin Ratio: 1.7 (ref 1.2–2.2)
Albumin: 4.3 g/dL (ref 3.8–4.8)
Alkaline Phosphatase: 78 IU/L (ref 44–121)
BUN/Creatinine Ratio: 13 (ref 12–28)
BUN: 11 mg/dL (ref 8–27)
Bilirubin Total: 0.5 mg/dL (ref 0.0–1.2)
CO2: 24 mmol/L (ref 20–29)
Calcium: 9.2 mg/dL (ref 8.7–10.3)
Chloride: 99 mmol/L (ref 96–106)
Creatinine, Ser: 0.86 mg/dL (ref 0.57–1.00)
Globulin, Total: 2.5 g/dL (ref 1.5–4.5)
Glucose: 86 mg/dL (ref 70–99)
Potassium: 5 mmol/L (ref 3.5–5.2)
Sodium: 136 mmol/L (ref 134–144)
Total Protein: 6.8 g/dL (ref 6.0–8.5)
eGFR: 75 mL/min/{1.73_m2} (ref >59)

## 2021-08-18 LAB — LIPID PANEL
Chol/HDL Ratio: 4.1 ratio (ref 0.0–4.4)
Cholesterol, Total: 211 mg/dL — ABNORMAL HIGH (ref 100–199)
HDL: 52 mg/dL (ref >39)
LDL Chol Calc (NIH): 145 mg/dL — ABNORMAL HIGH (ref 0–99)
Triglycerides: 79 mg/dL (ref 0–149)
VLDL Cholesterol Cal: 14 mg/dL (ref 5–40)

## 2021-08-18 LAB — HEMOGLOBIN A1C
Est. average glucose Bld gHb Est-mCnc: 105 mg/dL
Hgb A1c MFr Bld: 5.3 % (ref 4.8–5.6)

## 2021-08-18 LAB — TSH: TSH: 4.88 u[IU]/mL — ABNORMAL HIGH (ref 0.450–4.500)

## 2021-08-18 NOTE — Progress Notes (Signed)
Labs generally good. Will discuss with patient at CPE 08/24/2021

## 2021-08-18 NOTE — Progress Notes (Signed)
Macrobid effective antibiotic which was started at time of visit.

## 2021-08-24 ENCOUNTER — Other Ambulatory Visit: Payer: Self-pay

## 2021-08-24 ENCOUNTER — Ambulatory Visit (INDEPENDENT_AMBULATORY_CARE_PROVIDER_SITE_OTHER): Payer: Medicare PPO | Admitting: Nurse Practitioner

## 2021-08-24 ENCOUNTER — Encounter: Payer: Self-pay | Admitting: Nurse Practitioner

## 2021-08-24 VITALS — BP 133/85 | HR 75 | Temp 98.1°F | Ht 65.0 in | Wt 143.6 lb

## 2021-08-24 DIAGNOSIS — Z23 Encounter for immunization: Secondary | ICD-10-CM

## 2021-08-24 DIAGNOSIS — Z0001 Encounter for general adult medical examination with abnormal findings: Secondary | ICD-10-CM | POA: Diagnosis not present

## 2021-08-24 NOTE — Progress Notes (Signed)
Complete physical exam   Patient: Paula Hodges   DOB: [birth year]   66 y.o. Female  MRN: [id number] Visit Date: 08/24/2021    Chief Complaint  Patient presents with   Annual Exam   Subjective    Paula Hodges is a 66 y.o. female who presents today for a complete physical exam.  She reports consuming a  generally healthy  diet.  She generally feels well. She does not have additional problems to discuss today.  HPI  -states that over the last 1.5 years, she has had increased, family related stress.  -had shingles earlier this year. Was present under the arm and around the upper aspect of the left breast. Initial shingles infection was earlier in the year. Was started on antivirals. Continues to have nerve pain associated with the lesions.   Past Medical History:  Diagnosis Date   Hyperlipidemia    Past Surgical History:  Procedure Laterality Date   TONSILLECTOMY     Social History   Socioeconomic History   Marital status: Married    Spouse name: Not on file   Number of children: Not on file   Years of education: Not on file   Highest education level: Not on file  Occupational History   Not on file  Tobacco Use   Smoking status: Former    Years: 10.00    Types: Cigarettes    Quit date: 08/01/1979    Years since quitting: 42.0   Smokeless tobacco: Never  Vaping Use   Vaping Use: Never used  Substance and Sexual Activity   Alcohol use: Not Currently    Alcohol/week: 0.0 standard drinks   Drug use: No   Sexual activity: Yes    Birth control/protection: None  Other Topics Concern   Not on file  Social History Narrative   Not on file   Social Determinants of Health   Financial Resource Strain: Not on file  Food Insecurity: Not on file  Transportation Needs: Not on file  Physical Activity: Not on file  Stress: Not on file  Social Connections: Not on file  Intimate Partner Violence: Not on file   Family Status  Relation Name Status   Mother  Deceased   Father   Deceased   Sister  Alive   Brother 1 Alive   Brother 2 [place]  (Not Specified)   Family History  Problem Relation Age of Onset   Lymphoma Mother    Lung cancer Father    Liver cancer Father    Hypertension Father    Hyperlipidemia Father    Obesity Sister    Hypertension Brother    Hyperlipidemia Brother    Diabetes Paternal Uncle    No Known Allergies  Patient Care Team: Ronnell Freshwater, NP as PCP - General (Family Medicine) Lucilla Lame, MD as Consulting Physician (Gastroenterology)   Medications: Outpatient Medications Prior to Visit  Medication Sig   MULTIPLE VITAMIN PO Take by mouth.   TURMERIC PO Take by mouth daily.   [DISCONTINUED] nitrofurantoin, macrocrystal-monohydrate, (MACROBID) 100 MG capsule Take 1 capsule (100 mg total) by mouth 2 (two) times daily.   No facility-administered medications prior to visit.    Review of Systems  Constitutional:  Negative for activity change, appetite change, chills, fatigue and fever.  HENT:  Negative for congestion, postnasal drip, rhinorrhea, sinus pressure, sinus pain, sneezing and sore throat.   Eyes: Negative.   Respiratory:  Negative for cough, chest tightness, shortness of breath  and wheezing.   Cardiovascular:  Negative for chest pain and palpitations.  Gastrointestinal:  Negative for abdominal pain, constipation, diarrhea, nausea and vomiting.  Endocrine: Negative for cold intolerance, heat intolerance, polydipsia and polyuria.  Genitourinary:  Negative for dyspareunia, dysuria, flank pain, frequency and urgency.  Musculoskeletal:  Negative for arthralgias, back pain and myalgias.  Skin:  Negative for rash.  Allergic/Immunologic: Negative for environmental allergies.  Neurological:  Negative for dizziness, weakness and headaches.  Hematological:  Negative for adenopathy.  Psychiatric/Behavioral:  The patient is not nervous/anxious.    Last CBC Lab Results  Component Value Date   WBC 4.9 08/17/2021    HGB 13.7 08/17/2021   HCT 39.1 08/17/2021   MCV 88 08/17/2021   MCH 30.7 08/17/2021   RDW 11.8 08/17/2021   PLT 240 72/53/6644   Last metabolic panel Lab Results  Component Value Date   GLUCOSE 86 08/17/2021   NA 136 08/17/2021   K 5.0 08/17/2021   CL 99 08/17/2021   CO2 24 08/17/2021   BUN 11 08/17/2021   CREATININE 0.86 08/17/2021   EGFR 75 08/17/2021   CALCIUM 9.2 08/17/2021   PROT 6.8 08/17/2021   ALBUMIN 4.3 08/17/2021   LABGLOB 2.5 08/17/2021   AGRATIO 1.7 08/17/2021   BILITOT 0.5 08/17/2021   ALKPHOS 78 08/17/2021   AST 14 08/17/2021   ALT 10 08/17/2021   Last lipids Lab Results  Component Value Date   CHOL 211 (H) 08/17/2021   HDL 52 08/17/2021   LDLCALC 145 (H) 08/17/2021   TRIG 79 08/17/2021   CHOLHDL 4.1 08/17/2021   Last hemoglobin A1c Lab Results  Component Value Date   HGBA1C 5.3 08/17/2021   Last thyroid functions Lab Results  Component Value Date   TSH 4.880 (H) 08/17/2021   T3TOTAL 127 07/09/2020   Last vitamin D Lab Results  Component Value Date   VD25OH 32.0 07/09/2020       Objective     Today's Vitals   08/24/21 0922  BP: 133/85  Pulse: 75  Temp: 98.1 F (36.7 C)  SpO2: 100%  Weight: 143 lb 9.6 oz (65.1 kg)  Height: $Remove'5\' 5"'tmFnuEO$  (1.651 m)   Body mass index is 23.9 kg/m.   BP Readings from Last 3 Encounters:  08/24/21 133/85  08/12/21 137/68  10/11/20 (!) 109/53    Wt Readings from Last 3 Encounters:  08/24/21 143 lb 9.6 oz (65.1 kg)  08/12/21 147 lb 14.4 oz (67.1 kg)  10/11/20 151 lb 14.4 oz (68.9 kg)     Physical Exam Vitals and nursing note reviewed.  Constitutional:      Appearance: Normal appearance. She is well-developed.  HENT:     Head: Normocephalic and atraumatic.     Right Ear: Tympanic membrane, ear canal and external ear normal.     Left Ear: Tympanic membrane, ear canal and external ear normal.     Mouth/Throat:     Mouth: Mucous membranes are moist.     Pharynx: Oropharynx is clear.  Eyes:      Extraocular Movements: Extraocular movements intact.     Conjunctiva/sclera: Conjunctivae normal.     Pupils: Pupils are equal, round, and reactive to light.  Neck:     Vascular: No carotid bruit.  Cardiovascular:     Rate and Rhythm: Normal rate and regular rhythm.     Pulses: Normal pulses.     Heart sounds: Normal heart sounds.  Pulmonary:     Effort: Pulmonary effort is normal.  Breath sounds: Normal breath sounds.  Chest:  Breasts:    Right: Normal. No swelling, bleeding, inverted nipple, mass, nipple discharge, skin change or tenderness.     Left: Normal. No swelling, bleeding, inverted nipple, mass, nipple discharge, skin change or tenderness.    Abdominal:     General: Bowel sounds are normal. There is no distension.     Palpations: Abdomen is soft. There is no mass.     Tenderness: There is no abdominal tenderness. There is no guarding or rebound.     Hernia: No hernia is present.  Musculoskeletal:        General: Normal range of motion.     Cervical back: Normal range of motion and neck supple.  Lymphadenopathy:     Cervical: No cervical adenopathy.     Upper Body:     Right upper body: No axillary adenopathy.     Left upper body: No axillary adenopathy.  Skin:    General: Skin is warm and dry.     Capillary Refill: Capillary refill takes less than 2 seconds.  Neurological:     General: No focal deficit present.     Mental Status: She is alert and oriented to person, place, and time.  Psychiatric:        Mood and Affect: Mood normal.        Behavior: Behavior normal.        Thought Content: Thought content normal.        Judgment: Judgment normal.     Last depression screening scores PHQ 2/9 Scores 08/24/2021 08/12/2021 10/11/2020  PHQ - 2 Score 0 0 0  PHQ- 9 Score 1 0 1   Last fall risk screening Fall Risk  08/24/2021  Falls in the past year? 0  Number falls in past yr: 0  Injury with Fall? 0  Risk for fall due to : -  Follow up Falls evaluation  completed   Last Audit-C alcohol use screening Alcohol Use Disorder Test (AUDIT) 12/14/2017  1. How often do you have a drink containing alcohol? 0   A score of 3 or more in women, and 4 or more in men indicates increased risk for alcohol abuse, EXCEPT if all of the points are from question 1     Assessment & Plan    Routine Health Maintenance and Physical Exam    Immunization History  Administered Date(s) Administered   Influenza,inj,Quad PF,6+ Mos 05/15/2019   Influenza-Unspecified 05/30/2015   PFIZER(Purple Top)SARS-COV-2 Vaccination 12/19/2019   Tdap 11/15/2011   Zoster Recombinat (Shingrix) 08/24/2021    Health Maintenance  Topic Date Due   COVID-19 Vaccine (2 - Pfizer risk series) 01/09/2020   PAP SMEAR-Modifier  08/24/2021 (Originally 06/05/2016)   INFLUENZA VACCINE  10/28/2021 (Originally 02/28/2021)   Pneumonia Vaccine 27+ Years old (1 - PCV) 08/24/2022 (Originally 01[birth year]/1962)   DEXA SCAN  08/24/2022 (Originally 01/23/2021)   Hepatitis C Screening  08/24/2022 (Originally 01/23/1974)   HIV Screening  08/24/2022 (Originally 01/24/1971)   MAMMOGRAM  08/30/2022 (Originally 07/17/2019)   Zoster Vaccines- Shingrix (2 of 2) 10/19/2021   TETANUS/TDAP  11/14/2021   Fecal DNA (Cologuard)  06/08/2022   HPV VACCINES  Aged Out   COLONOSCOPY (Pts 45-57yrs Insurance coverage will need to be confirmed)  Discontinued    Discussed health benefits of physical activity, and encouraged her to engage in regular exercise appropriate for her age and condition.  Problem List Items Addressed This Visit   None Visit Diagnoses  Encounter for general adult medical examination with abnormal findings    -  Primary   Need for shingles vaccine       Relevant Orders   Varicella-zoster vaccine IM (Completed)        Return in about 1 year (around 08/24/2022) for health maintenance exam, FBW a week prior to visit. needs 2nd shingles in 4 months.        Ronnell Freshwater, NP  [facility]  Primary Care at [facility] [phone] (phone) [phone] (fax)  [place]

## 2021-08-26 ENCOUNTER — Telehealth: Payer: Self-pay | Admitting: Nurse Practitioner

## 2021-08-26 NOTE — Telephone Encounter (Signed)
Called pt she is advised of there will be some swollen and redness due to the vaccination that is common with the shingles

## 2021-08-26 NOTE — Telephone Encounter (Signed)
When patient was in here on Wednesday she had gotten her shingles vaccination and now she said at the site it is really hard and redness around it as well as warm to the touch. Is this normal? Please advise. [phone]

## 2021-09-20 ENCOUNTER — Ambulatory Visit: Payer: Medicare PPO | Admitting: Dermatology

## 2021-09-20 ENCOUNTER — Other Ambulatory Visit: Payer: Self-pay

## 2021-09-20 DIAGNOSIS — D1800 Hemangioma unspecified site: Secondary | ICD-10-CM

## 2021-09-20 DIAGNOSIS — D2261 Melanocytic nevi of right upper limb, including shoulder: Secondary | ICD-10-CM

## 2021-09-20 DIAGNOSIS — Z1283 Encounter for screening for malignant neoplasm of skin: Secondary | ICD-10-CM

## 2021-09-20 DIAGNOSIS — L720 Epidermal cyst: Secondary | ICD-10-CM | POA: Diagnosis not present

## 2021-09-20 DIAGNOSIS — L821 Other seborrheic keratosis: Secondary | ICD-10-CM

## 2021-09-20 DIAGNOSIS — L578 Other skin changes due to chronic exposure to nonionizing radiation: Secondary | ICD-10-CM

## 2021-09-20 DIAGNOSIS — D229 Melanocytic nevi, unspecified: Secondary | ICD-10-CM | POA: Diagnosis not present

## 2021-09-20 DIAGNOSIS — L814 Other melanin hyperpigmentation: Secondary | ICD-10-CM

## 2021-09-20 DIAGNOSIS — L570 Actinic keratosis: Secondary | ICD-10-CM

## 2021-09-20 DIAGNOSIS — L609 Nail disorder, unspecified: Secondary | ICD-10-CM

## 2021-09-20 NOTE — Progress Notes (Signed)
Follow-Up Visit   Subjective  Paula Hodges is a 66 y.o. female who presents for the following: Annual Exam (Hx AK's - malar cheeks. No hx of skin CA or dysplastic nevi. ). The patient presents for Total-Body Skin Exam (TBSE) for skin cancer screening and mole check.  The patient has spots, moles and lesions to be evaluated, some may be new or changing.   The following portions of the chart were reviewed this encounter and updated as appropriate:       Review of Systems:  No other skin or systemic complaints except as noted in HPI or Assessment and Plan.  Objective  Well appearing patient in no apparent distress; mood and affect are within normal limits.  A full examination was performed including scalp, head, eyes, ears, nose, lips, neck, chest, axillae, abdomen, back, buttocks, bilateral upper extremities, bilateral lower extremities, hands, feet, fingers, toes, fingernails, and toenails. All findings within normal limits unless otherwise noted below.  L nasal tip x 1, R malar cheek x 3, L malar cheek x 3 (7) Erythematous thin papules/macules with gritty scale.   Chest Firm SQ nodule.   R post upper arm 0.7 cm speckled medium light brown papule.   Toenails Toenail dystrophy. 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/up    Destruction of lesion - L nasal tip x 1, R malar cheek x 3, L malar cheek x 3  Destruction method: cryotherapy   Informed consent: discussed and consent obtained   Timeout:  patient name, date of birth, surgical site, and procedure verified Lesion destroyed using liquid nitrogen: Yes   Region frozen until ice ball extended beyond lesion: Yes   Outcome: patient tolerated procedure well with no complications   Post-procedure details: wound care instructions given    Additional details:  Prior to procedure, discussed risks of blister formation, small wound, skin dyspigmentation, or rare scar following cryotherapy. Recommend Vaseline ointment to treated areas while healing.   Epidermal cyst Chest  Benign-appearing. Exam most consistent with an epidermal inclusion cyst. Discussed that a cyst is a benign growth that can grow over time and sometimes get irritated or inflamed. Recommend observation if it is not bothersome. Discussed option of surgical excision to remove it if it is growing, symptomatic, or other changes noted. Please call for new or changing lesions so they can be evaluated.     Nevus R post upper arm  Benign-appearing.  Observation.  Call clinic for new or changing moles.  Recommend daily use of broad spectrum spf 30+ sunscreen to sun-exposed areas.   Nail problem Toenails  Dystrophy from trauma vrs fungal- observation    Lentigines - Scattered tan macules - Due to sun exposure - Benign-appearing, observe - Recommend daily broad spectrum sunscreen SPF 30+ to sun-exposed areas, reapply every 2 hours as needed. - Call for any changes  Seborrheic Keratoses - Stuck-on, waxy, tan-brown papules and/or plaques  - Benign-appearing - Discussed benign etiology and prognosis. - Observe - Call for any changes  Melanocytic Nevi - Tan-brown and/or pink-flesh-colored symmetric macules and papules - Benign appearing on exam today - Observation - Call clinic for new or changing moles - Recommend daily use of broad spectrum spf 30+ sunscreen to sun-exposed areas.   Hemangiomas - Red papules -  Discussed benign nature - Observe - Call for any changes  Actinic Damage - Chronic condition, secondary to cumulative UV/sun exposure - diffuse scaly erythematous macules with underlying dyspigmentation - Recommend daily broad spectrum sunscreen SPF 30+ to sun-exposed areas, reapply every 2 hours as needed.  - Staying in the shade or wearing  long sleeves, sun glasses (UVA+UVB protection) and wide brim hats (4-inch brim around the entire circumference of the hat) are also recommended for sun protection.  - Call for new or changing lesions.  Skin cancer screening performed today.  Return in about 1 year (around 09/20/2022) for TBSE; 3 mths AK f/u pt to cancel if resolved.  Luther Redo, CMA, am acting as scribe for Brendolyn Patty, MD .  Documentation: I have reviewed the above documentation for accuracy and completeness, and I agree with the above.  Brendolyn Patty MD

## 2021-09-20 NOTE — Patient Instructions (Signed)

## 2021-12-20 ENCOUNTER — Ambulatory Visit: Payer: Medicare PPO | Admitting: Dermatology

## 2021-12-23 ENCOUNTER — Other Ambulatory Visit: Payer: Medicare PPO

## 2022-04-19 ENCOUNTER — Ambulatory Visit: Payer: Medicare PPO | Admitting: Dermatology

## 2022-05-15 ENCOUNTER — Ambulatory Visit: Payer: Medicare PPO | Admitting: Dermatology

## 2022-05-15 DIAGNOSIS — L821 Other seborrheic keratosis: Secondary | ICD-10-CM

## 2022-05-15 DIAGNOSIS — L570 Actinic keratosis: Secondary | ICD-10-CM | POA: Diagnosis not present

## 2022-05-15 DIAGNOSIS — L219 Seborrheic dermatitis, unspecified: Secondary | ICD-10-CM | POA: Diagnosis not present

## 2022-05-15 DIAGNOSIS — L578 Other skin changes due to chronic exposure to nonionizing radiation: Secondary | ICD-10-CM

## 2022-05-15 NOTE — Patient Instructions (Signed)
Start 1% hydrocortisone cream - Apply to rash around nose, forehead twice a day until improved.   Cryotherapy Aftercare  Wash gently with soap and water everyday.   Apply Vaseline and Band-Aid daily until healed.   5-Fluorouracil/Calcipotriene Patient Education   Actinic keratoses are the dry, red scaly spots on the skin caused by sun damage. A portion of these spots can turn into skin cancer with time, and treating them can help prevent development of skin cancer.   Treatment of these spots requires removal of the defective skin cells. There are various ways to remove actinic keratoses, including freezing with liquid nitrogen, treatment with creams, or treatment with a blue light procedure in the office.   5-fluorouracil cream is a topical cream used to treat actinic keratoses. It works by interfering with the growth of abnormal fast-growing skin cells, such as actinic keratoses. These cells peel off and are replaced by healthy ones.   5-fluorouracil/calcipotriene is a combination of the 5-fluorouracil cream with a vitamin D analog cream called calcipotriene. The calcipotriene alone does not treat actinic keratoses. However, when it is combined with 5-fluorouracil, it helps the 5-fluorouracil treat the actinic keratoses much faster so that the same results can be achieved with a much shorter treatment time.  INSTRUCTIONS FOR 5-FLUOROURACIL/CALCIPOTRIENE CREAM:   5-fluorouracil/calcipotriene cream typically only needs to be used for 5-7 days. A thin layer should be applied twice a day to the treatment areas recommended by your physician.   If your physician prescribed you separate tubes of 5-fluourouracil and calcipotriene, apply a thin layer of 5-fluorouracil followed by a thin layer of calcipotriene.   - Start 5-fluorouracil/calcipotriene cream twice a day for 5-7 days to affected areas including cheeks, nose. Prescription sent to [facility]. Patient advised they  will receive an email to purchase the medication online and have it sent to their home. Patient provided with handout reviewing treatment course and side effects and advised to call or message [place] on MyChart with any concerns.  Instructions for [facility] Medications  One or more of your medications was sent to the [facility] mail order compounding pharmacy. You will receive an email from them and can purchase the medicine through that link. It will then be mailed to your home at the address you confirmed. If for any reason you do not receive an email from them, please check your spam folder. If you still do not find the email, please let us know. [facility] phone number is [phone].   Reviewed course of treatment and expected reaction.  Patient advised to expect inflammation and crusting and advised that erosions are possible.  Patient advised to be diligent with sun protection during and after treatment. Counseled to keep medication out of reach of children and pets.   Avoid contact with your eyes, nostrils, and mouth. Do not use 5-fluorouracil/calcipotriene cream on infected or open wounds.   You will develop redness, irritation and some crusting at areas where you have pre-cancer damage/actinic keratoses. IF YOU DEVELOP PAIN, BLEEDING, OR SIGNIFICANT CRUSTING, STOP THE TREATMENT EARLY - you have already gotten a good response and the actinic keratoses should clear up well.  Wash your hands after applying 5-fluorouracil 5% cream on your skin.   A moisturizer or sunscreen with a minimum SPF 30 should be applied each morning.   Once you have finished the treatment, you can apply a thin layer of Vaseline twice a day to irritated areas to soothe and calm the areas more  quickly. If you experience significant discomfort, contact your physician.  For some patients it is necessary to repeat the treatment for best results.  SIDE EFFECTS: When using 5-fluorouracil/calcipotriene cream,  you may have mild irritation, such as redness, dryness, swelling, or a mild burning sensation. This usually resolves within 2 weeks. The more actinic keratoses you have, the more redness and inflammation you can expect during treatment. Eye irritation has been reported rarely. If this occurs, please let us know.  If you have any trouble using this cream, please call the office. If you have any other questions about this information, please do not hesitate to ask me before you leave the office. Due to recent changes in healthcare laws, you may see results of your pathology and/or laboratory studies on MyChart before the doctors have had a chance to review them. We understand that in some cases there may be results that are confusing or concerning to you. Please understand that not all results are received at the same time and often the doctors may need to interpret multiple results in order to provide you with the best plan of care or course of treatment. Therefore, we ask that you please give [place] 2 business days to thoroughly review all your results before contacting the office for clarification. Should we see a critical lab result, you will be contacted sooner.   If You Need Anything After Your Visit  If you have any questions or concerns for your doctor, please call our main line at [phone] and press option 4 to reach your doctor's medical assistant. If no one answers, please leave a voicemail as directed and we will return your call as soon as possible. Messages left after 4 pm will be answered the following business day.   You may also send [place] a message via [place]. We typically respond to MyChart messages within 1-2 business days.  For prescription refills, please ask your pharmacy to contact our office. Our fax number is [phone].  If you have an urgent issue when the clinic is closed that cannot wait until the next business day, you can page your doctor at the number below.    Please  note that while we do our best to be available for urgent issues outside of office hours, we are not available 24/7.   If you have an urgent issue and are unable to reach [place], you may choose to seek medical care at your doctor's office, retail clinic, urgent care center, or emergency room.  If you have a medical emergency, please immediately call 911 or go to the emergency department.  Pager Numbers  - Dr. Nehemiah Massed: [phone]  - Dr. Laurence Ferrari: [phone]  - Dr. Nicole Kindred: [phone]  In the event of inclement weather, please call our main line at [phone] for an update on the status of any delays or closures.  Dermatology Medication Tips: Please keep the boxes that topical medications come in in order to help keep track of the instructions about where and how to use these. Pharmacies typically print the medication instructions only on the boxes and not directly on the medication tubes.   If your medication is too expensive, please contact our office at [phone] option 4 or send [place] a message through [place].   We are unable to tell what your co-pay for medications will be in advance as this is different depending on your insurance coverage. However, we may be able to find a substitute medication at lower cost or fill out paperwork  to get insurance to cover a needed medication.   If a prior authorization is required to get your medication covered by your insurance company, please allow [place] 1-2 business days to complete this process.  Drug prices often vary depending on where the prescription is filled and some pharmacies may offer cheaper prices.  The website www.goodrx.com contains coupons for medications through different pharmacies. The prices here do not account for what the cost may be with help from insurance (it may be cheaper with your insurance), but the website can give you the price if you did not use any insurance.  - You can print the associated coupon and take it with  your prescription to the pharmacy.  - You may also stop by our office during regular business hours and pick up a GoodRx coupon card.  - If you need your prescription sent electronically to a different pharmacy, notify our office through [facility] or by phone at [phone] option 4.     Si Usted Necesita Algo Despus de Su Visita  Tambin puede enviarnos un mensaje a travs de Pharmacist, community. Por lo general respondemos a los mensajes de MyChart en el transcurso de 1 a 2 das hbiles.  Para renovar recetas, por favor pida a su farmacia que se ponga en contacto con nuestra oficina. Harland Dingwall de fax es [place] [phone].  Si tiene un asunto urgente cuando la clnica est cerrada y que no puede esperar hasta el siguiente da hbil, puede llamar/localizar a su doctor(a) al nmero que aparece a continuacin.   Por favor, tenga en cuenta que aunque hacemos todo lo posible para estar disponibles para asuntos urgentes fuera del horario de [place], no estamos disponibles las 24 horas del da, los 7 das de la [place].   Si tiene un problema urgente y no puede comunicarse con nosotros, puede optar por buscar atencin mdica  en el consultorio de su doctor(a), en una clnica privada, en un centro de atencin urgente o en una sala de emergencias.  Si tiene Engineering geologist, por favor llame inmediatamente al 911 o vaya a la sala de emergencias.  Nmeros de bper  - Dr. Nehemiah Massed: [phone]  - Dra. Moye: [phone]  - Dra. Nicole Kindred: [phone]  En caso de inclemencias del [place], por favor llame a Johnsie Kindred principal al [phone] para una actualizacin sobre el [place] de cualquier retraso o cierre.  Consejos para la medicacin en dermatologa: Por favor, guarde las cajas en las que vienen los medicamentos de uso tpico para ayudarle a seguir las instrucciones sobre dnde y cmo usarlos. Las farmacias generalmente imprimen las instrucciones del medicamento slo en las cajas y  no directamente en los tubos del [place].   Si su medicamento es muy caro, por favor, pngase en contacto con Zigmund Daniel llamando al [phone] y presione la opcin 4 o envenos un mensaje a travs de Pharmacist, community.   No podemos decirle cul ser su copago por los medicamentos por adelantado ya que esto es diferente dependiendo de la cobertura de su seguro. Sin embargo, es posible que podamos encontrar un medicamento sustituto a Electrical engineer un formulario para que el seguro cubra el medicamento que se considera necesario.   Si se requiere una autorizacin previa para que su compaa de seguros [place] su medicamento, por favor permtanos de 1 a 2 das hbiles para completar este proceso.  Los precios de los medicamentos varan con frecuencia dependiendo del Environmental consultant de dnde se surte la receta y [place]  pueden ofrecer precios ms baratos.  El sitio web www.goodrx.com tiene cupones para medicamentos de Airline pilot. Los precios aqu no tienen en cuenta lo que podra costar con la ayuda del seguro (puede ser ms barato con su seguro), pero el sitio web puede darle el precio si no utiliz Research scientist (physical sciences).  - Puede imprimir el cupn correspondiente y llevarlo con su receta a la farmacia.  - Tambin puede pasar por nuestra oficina durante el horario de atencin regular y Charity fundraiser una tarjeta de cupones de GoodRx.  - Si necesita que su receta se enve electrnicamente a una farmacia diferente, informe a nuestra oficina a travs de MyChart de [place] o por telfono llamando al [phone] y presione la opcin 4.

## 2022-05-15 NOTE — Progress Notes (Signed)
Follow-Up Visit   Subjective  Paula Hodges is a 66 y.o. female who presents for the following: Follow-up.  Patient presents for follow-up hypertrophic Aks of the face. She still notices an area on the right malar cheek that didn't completely go away. She also has a rough spot on her back she would like checked.   The following portions of the chart were reviewed this encounter and updated as appropriate:       Review of Systems:  No other skin or systemic complaints except as noted in HPI or Assessment and Plan.  Objective  Well appearing patient in no apparent distress; mood and affect are within normal limits.  A focused examination was performed including face. Relevant physical exam findings are noted in the Assessment and Plan.  bilateral alar creases Pink patches with greasy scale.   Right Malar Cheek x 3; Left Malar Cheek x 1 (4) Pink/brown scaly macules.    Assessment & Plan  Actinic Damage - Severe, confluent actinic changes with pre-cancerous actinic keratoses  - Severe, chronic, not at goal, secondary to cumulative UV radiation exposure over time - diffuse scaly erythematous macules and papules with underlying dyspigmentation - Discussed Prescription "Field Treatment" for Severe, Chronic Confluent Actinic Changes with Pre-Cancerous Actinic Keratoses Field treatment involves treatment of an entire area of skin that has confluent Actinic Changes (Sun/ Ultraviolet light damage) and PreCancerous Actinic Keratoses by method of PhotoDynamic Therapy (PDT) and/or prescription Topical Chemotherapy agents such as 5-fluorouracil, 5-fluorouracil/calcipotriene, and/or imiquimod.  The purpose is to decrease the number of clinically evident and subclinical PreCancerous lesions to prevent progression to development of skin cancer by chemically destroying early precancer changes that may or may not be visible.  It has been shown to reduce the risk of developing skin cancer in the  treated area. As a result of treatment, redness, scaling, crusting, and open sores may occur during treatment course. One or more than one of these methods may be used and may have to be used several times to control, suppress and eliminate the PreCancerous changes. Discussed treatment course, expected reaction, and possible side effects. - Recommend daily broad spectrum sunscreen SPF 30+ to sun-exposed areas, reapply every 2 hours as needed.  - Staying in the shade or wearing long sleeves, sun glasses (UVA+UVB protection) and wide brim hats (4-inch brim around the entire circumference of the hat) are also recommended. - Call for new or changing lesions. - Start 5-fluorouracil/calcipotriene cream twice a day for 5-7 days to affected areas including cheeks, nose. Prescription sent to [facility]. Patient advised they will receive an email to purchase the medication online and have it sent to their home. Patient provided with handout reviewing treatment course and side effects and advised to call or message [place] on MyChart with any concerns.  Reviewed course of treatment and expected reaction.  Patient advised to expect inflammation and crusting and advised that erosions are possible.  Patient advised to be diligent with sun protection during and after treatment. Counseled to keep medication out of reach of children and pets.  Seborrheic Keratoses - Stuck-on, waxy, tan-brown papules and/or plaques, including left flank - Benign-appearing - Discussed benign etiology and prognosis. - Observe, Discussed cryotherapy if irritated. - Call for any changes  Seborrheic dermatitis bilateral alar creases  Chronic and persistent condition with duration or expected duration over one year. Condition is symptomatic/ bothersome to patient. Not currently at goal.   Seborrheic Dermatitis  -  is a  chronic persistent rash characterized by pinkness and scaling most commonly of the mid face but  also can occur on the scalp (dandruff), ears; mid chest, mid back and groin.  It tends to be exacerbated by stress and cooler weather.  People who have neurologic disease may experience new onset or exacerbation of existing seborrheic dermatitis.  The condition is not curable but treatable and can be controlled.  Start 1% hydrocortisone cream - Apply to rash around nose twice daily prn. If not improving, will send in Rx.   AK (actinic keratosis) (4) Right Malar Cheek x 3; Left Malar Cheek x 1  Residual  Actinic keratoses are precancerous spots that appear secondary to cumulative UV radiation exposure/sun exposure over time. They are chronic with expected duration over 1 year. A portion of actinic keratoses will progress to squamous cell carcinoma of the skin. It is not possible to reliably predict which spots will progress to skin cancer and so treatment is recommended to prevent development of skin cancer.  Recommend daily broad spectrum sunscreen SPF 30+ to sun-exposed areas, reapply every 2 hours as needed.  Recommend staying in the shade or wearing long sleeves, sun glasses (UVA+UVB protection) and wide brim hats (4-inch brim around the entire circumference of the hat). Call for new or changing lesions.  Destruction of lesion - Right Malar Cheek x 3; Left Malar Cheek x 1  Destruction method: cryotherapy   Informed consent: discussed and consent obtained   Lesion destroyed using liquid nitrogen: Yes   Region frozen until ice ball extended beyond lesion: Yes   Outcome: patient tolerated procedure well with no complications   Post-procedure details: wound care instructions given   Additional details:  Prior to procedure, discussed risks of blister formation, small wound, skin dyspigmentation, or rare scar following cryotherapy. Recommend Vaseline ointment to treated areas while healing.    Return as scheduled, for TBSE, AKs.  IJamesetta Orleans, CMA, am acting as scribe for Brendolyn Patty,  MD .  Documentation: I have reviewed the above documentation for accuracy and completeness, and I agree with the above.  Brendolyn Patty MD

## 2022-05-29 ENCOUNTER — Other Ambulatory Visit: Payer: Self-pay | Admitting: Nurse Practitioner

## 2022-05-30 ENCOUNTER — Other Ambulatory Visit: Payer: Self-pay | Admitting: Nurse Practitioner

## 2022-05-30 DIAGNOSIS — Z1231 Encounter for screening mammogram for malignant neoplasm of breast: Secondary | ICD-10-CM

## 2022-06-26 ENCOUNTER — Ambulatory Visit: Payer: Medicare PPO | Admitting: Dermatology

## 2022-07-26 ENCOUNTER — Ambulatory Visit
Admission: RE | Admit: 2022-07-26 | Discharge: 2022-07-26 | Disposition: A | Discharge status: Home / Self Care | Payer: Medicare PPO | Source: Ambulatory Visit | Attending: Nurse Practitioner | Admitting: Nurse Practitioner

## 2022-07-26 ENCOUNTER — Encounter: Payer: Self-pay | Admitting: Nurse Practitioner

## 2022-07-26 DIAGNOSIS — Z1231 Encounter for screening mammogram for malignant neoplasm of breast: Secondary | ICD-10-CM | POA: Insufficient documentation

## 2022-09-26 ENCOUNTER — Ambulatory Visit: Payer: Medicare PPO | Admitting: Dermatology

## 2022-09-26 ENCOUNTER — Encounter: Payer: Self-pay | Admitting: Dermatology

## 2022-09-26 VITALS — BP 136/67 | HR 77

## 2022-09-26 DIAGNOSIS — Z1283 Encounter for screening for malignant neoplasm of skin: Secondary | ICD-10-CM | POA: Diagnosis not present

## 2022-09-26 DIAGNOSIS — D2261 Melanocytic nevi of right upper limb, including shoulder: Secondary | ICD-10-CM

## 2022-09-26 DIAGNOSIS — L578 Other skin changes due to chronic exposure to nonionizing radiation: Secondary | ICD-10-CM

## 2022-09-26 DIAGNOSIS — D239 Other benign neoplasm of skin, unspecified: Secondary | ICD-10-CM

## 2022-09-26 DIAGNOSIS — D235 Other benign neoplasm of skin of trunk: Secondary | ICD-10-CM

## 2022-09-26 DIAGNOSIS — L719 Rosacea, unspecified: Secondary | ICD-10-CM

## 2022-09-26 DIAGNOSIS — L814 Other melanin hyperpigmentation: Secondary | ICD-10-CM

## 2022-09-26 DIAGNOSIS — Z872 Personal history of diseases of the skin and subcutaneous tissue: Secondary | ICD-10-CM

## 2022-09-26 DIAGNOSIS — D229 Melanocytic nevi, unspecified: Secondary | ICD-10-CM

## 2022-09-26 DIAGNOSIS — L219 Seborrheic dermatitis, unspecified: Secondary | ICD-10-CM

## 2022-09-26 DIAGNOSIS — L821 Other seborrheic keratosis: Secondary | ICD-10-CM

## 2022-09-26 NOTE — Patient Instructions (Addendum)
Rosacea  What is rosacea? Rosacea (say: ro-zay-sha) is a common skin disease that usually begins as a trend of flushing or blushing easily.  As rosacea progresses, a persistent redness in the center of the face will develop and may gradually spread beyond the nose and cheeks to the forehead and chin.  In some cases, the ears, chest, and back could be affected.  Rosacea may appear as tiny blood vessels or small red bumps that occur in crops.  Frequently they can contain pus, and are called "pustules".  If the bumps do not contain pus, they are referred to as "papules".  Rarely, in prolonged, untreated cases of rosacea, the oil glands of the nose and cheeks may become permanently enlarged.  This is called rhinophyma, and is seen more frequently in men.  Signs and Risks In its beginning stages, rosacea tends to come and go, which makes it difficult to recognize.  It can start as intermittent flushing of the face.  Eventually, blood vessels may become permanently visible.  Pustules and papules can appear, but can be mistaken for adult acne.  People of all races, ages, genders and ethnic groups are at risk of developing rosacea.  However, it is more common in women (especially around menopause) and adults with fair skin between the ages of 3 and 87.  Treatment Dermatologists typically recommend a combination of treatments to effectively manage rosacea.  Treatment can improve symptoms and may stop the progression of the rosacea.  Treatment may involve both topical and oral medications.  The tetracycline antibiotics are often used for their anti-inflammatory effect; however, because of the possibility of developing antibiotic resistance, they should not be used long term at full dose.  For dilated blood vessels the options include electrodessication (uses electric current through a small needle), laser treatment, and cosmetics to hide the redness.   With all forms of treatment, improvement is a slow process, and  patients may not see any results for the first 3-4 weeks.  It is very important to avoid the sun and other triggers.  Patients must wear sunscreen daily.  Skin Care Instructions: Cleanse the skin with a mild soap such as CeraVe cleanser, Cetaphil cleanser, or Dove soap once or twice daily as needed. Moisturize with Eucerin Redness Relief Daily Perfecting Lotion (has a subtle green tint), CeraVe Moisturizing Cream, or Oil of Olay Daily Moisturizer with sunscreen every morning and/or night as recommended. Makeup should be "non-comedogenic" (won't clog pores) and be labeled "for sensitive skin". Good choices for cosmetics are: Neutrogena, Almay, and Physician's Formula.  Any product with a green tint tends to offset a red complexion. If your eyes are dry and irritated, use artificial tears 2-3 times per day and cleanse the eyelids daily with baby shampoo.  Have your eyes examined at least every 2 years.  Be sure to tell your eye doctor that you have rosacea. Alcoholic beverages tend to cause flushing of the skin, and may make rosacea worse. Always wear sunscreen, protect your skin from extreme hot and cold temperatures, and avoid spicy foods, hot drinks, and mechanical irritation such as rubbing, scrubbing, or massaging the face.  Avoid harsh skin cleansers, cleansing masks, astringents, and exfoliation. If a particular product burns or makes your face feel tight, then it is likely to flare your rosacea. If you are having difficulty finding a sunscreen that you can tolerate, you may try switching to a chemical-free sunscreen.  These are ones whose active ingredient is zinc oxide or titanium dioxide  only.  They should also be fragrance free, non-comedogenic, and labeled for sensitive skin. Rosacea triggers may vary from person to person.  There are a variety of foods that have been reported to trigger rosacea.  Some patients find that keeping a diary of what they were doing when they flared helps them avoid  triggers.   Counseling for BBL / IPL / Laser and Coordination of Care Discussed the treatment option of Broad Band Light (BBL) /Intense Pulsed Light (IPL)/ Laser for skin discoloration, including brown spots and redness.  Typically we recommend at least 1-3 treatment sessions about 5-8 weeks apart for best results.  Cannot have tanned skin when BBL performed, and regular use of sunscreen is advised after the procedure to help maintain results. The patient's condition may also require "maintenance treatments" in the future.  The fee for BBL / laser treatments is $350 per treatment session for the whole face.  A fee can be quoted for other parts of the body.  Insurance typically does not pay for BBL/laser treatments and therefore the fee is an out-of-pocket cost.    Melanoma ABCDEs  Melanoma is the most dangerous type of skin cancer, and is the leading cause of death from skin disease.  You are more likely to develop melanoma if you: Have light-colored skin, light-colored eyes, or red or blond hair Spend a lot of time in the sun Tan regularly, either outdoors or in a tanning bed Have had blistering sunburns, especially during childhood Have a close family member who has had a melanoma Have atypical moles or large birthmarks  Early detection of melanoma is key since treatment is typically straightforward and cure rates are extremely high if we catch it early.   The first sign of melanoma is often a change in a mole or a new dark spot.  The ABCDE system is a way of remembering the signs of melanoma.  A for asymmetry:  The two halves do not match. B for border:  The edges of the growth are irregular. C for color:  A mixture of colors are present instead of an even brown color. D for diameter:  Melanomas are usually (but not always) greater than 67m - the size of a pencil eraser. E for evolution:  The spot keeps changing in size, shape, and color.  Please check your skin once per month between  visits. You can use a small mirror in front and a large mirror behind you to keep an eye on the back side or your body.   If you see any new or changing lesions before your next follow-up, please call to schedule a visit.  Please continue daily skin protection including broad spectrum sunscreen SPF 30+ to sun-exposed areas, reapplying every 2 hours as needed when you're outdoors.   Staying in the shade or wearing long sleeves, sun glasses (UVA+UVB protection) and wide brim hats (4-inch brim around the entire circumference of the hat) are also recommended for sun protection.    Seborrheic Keratosis  What causes seborrheic keratoses? Seborrheic keratoses are harmless, common skin growths that first appear during adult life.  As time goes by, more growths appear.  Some people may develop a large number of them.  Seborrheic keratoses appear on both covered and uncovered body parts.  They are not caused by sunlight.  The tendency to develop seborrheic keratoses can be inherited.  They vary in color from skin-colored to gray, brown, or even black.  They can be either smooth or  have a rough, warty surface.   Seborrheic keratoses are superficial and look as if they were stuck on the skin.  Under the microscope this type of keratosis looks like layers upon layers of skin.  That is why at times the top layer may seem to fall off, but the rest of the growth remains and re-grows.    Treatment Seborrheic keratoses do not need to be treated, but can easily be removed in the office.  Seborrheic keratoses often cause symptoms when they rub on clothing or jewelry.  Lesions can be in the way of shaving.  If they become inflamed, they can cause itching, soreness, or burning.  Removal of a seborrheic keratosis can be accomplished by freezing, burning, or surgery. If any spot bleeds, scabs, or grows rapidly, please return to have it checked, as these can be an indication of a skin cancer. Due to recent changes in  healthcare laws, you may see results of your pathology and/or laboratory studies on MyChart before the doctors have had a chance to review them. We understand that in some cases there may be results that are confusing or concerning to you. Please understand that not all results are received at the same time and often the doctors may need to interpret multiple results in order to provide you with the best plan of care or course of treatment. Therefore, we ask that you please give [place] 2 business days to thoroughly review all your results before contacting the office for clarification. Should we see a critical lab result, you will be contacted sooner.   If You Need Anything After Your Visit  If you have any questions or concerns for your doctor, please call our main line at [phone] and press option 4 to reach your doctor's medical assistant. If no one answers, please leave a voicemail as directed and we will return your call as soon as possible. Messages left after 4 pm will be answered the following business day.   You may also send [place] a message via [place]. We typically respond to MyChart messages within 1-2 business days.  For prescription refills, please ask your pharmacy to contact our office. Our fax number is [phone].  If you have an urgent issue when the clinic is closed that cannot wait until the next business day, you can page your doctor at the number below.    Please note that while we do our best to be available for urgent issues outside of office hours, we are not available 24/7.   If you have an urgent issue and are unable to reach [place], you may choose to seek medical care at your doctor's office, retail clinic, urgent care center, or emergency room.  If you have a medical emergency, please immediately call 911 or go to the emergency department.  Pager Numbers  - Dr. Nehemiah Massed: [phone]  - Dr. Laurence Ferrari: [phone]  - Dr. Nicole Kindred: [phone]  In the event of inclement  weather, please call our main line at [phone] for an update on the status of any delays or closures.  Dermatology Medication Tips: Please keep the boxes that topical medications come in in order to help keep track of the instructions about where and how to use these. Pharmacies typically print the medication instructions only on the boxes and not directly on the medication tubes.   If your medication is too expensive, please contact our office at [phone] option 4 or send [place] a message through [place].   We are unable  to tell what your co-pay for medications will be in advance as this is different depending on your insurance coverage. However, we may be able to find a substitute medication at lower cost or fill out paperwork to get insurance to cover a needed medication.   If a prior authorization is required to get your medication covered by your insurance company, please allow [place] 1-2 business days to complete this process.  Drug prices often vary depending on where the prescription is filled and some pharmacies may offer cheaper prices.  The website www.goodrx.com contains coupons for medications through different pharmacies. The prices here do not account for what the cost may be with help from insurance (it may be cheaper with your insurance), but the website can give you the price if you did not use any insurance.  - You can print the associated coupon and take it with your prescription to the pharmacy.  - You may also stop by our office during regular business hours and pick up a GoodRx coupon card.  - If you need your prescription sent electronically to a different pharmacy, notify our office through [facility] or by phone at [phone] option 4.     Si Usted Necesita Algo Despus de Su Visita  Tambin puede enviarnos un mensaje a travs de Pharmacist, community. Por lo general respondemos a los mensajes de MyChart en el transcurso de 1 a 2 das hbiles.  Para renovar recetas,  por favor pida a su farmacia que se ponga en contacto con nuestra oficina. Harland Dingwall de fax es [place] [phone].  Si tiene un asunto urgente cuando la clnica est cerrada y que no puede esperar hasta el siguiente da hbil, puede llamar/localizar a su doctor(a) al nmero que aparece a continuacin.   Por favor, tenga en cuenta que aunque hacemos todo lo posible para estar disponibles para asuntos urgentes fuera del horario de [place], no estamos disponibles las 24 horas del da, los 7 das de la [place].   Si tiene un problema urgente y no puede comunicarse con nosotros, puede optar por buscar atencin mdica  en el consultorio de su doctor(a), en una clnica privada, en un centro de atencin urgente o en una sala de emergencias.  Si tiene Engineering geologist, por favor llame inmediatamente al 911 o vaya a la sala de emergencias.  Nmeros de bper  - Dr. Nehemiah Massed: [phone]  - Dra. Moye: [phone]  - Dra. Nicole Kindred: [phone]  En caso de inclemencias del [place], por favor llame a Johnsie Kindred principal al [phone] para una actualizacin sobre el [place] de cualquier retraso o cierre.  Consejos para la medicacin en dermatologa: Por favor, guarde las cajas en las que vienen los medicamentos de uso tpico para ayudarle a seguir las instrucciones sobre dnde y cmo usarlos. Las farmacias generalmente imprimen las instrucciones del medicamento slo en las cajas y no directamente en los tubos del [place].   Si su medicamento es muy caro, por favor, pngase en contacto con Zigmund Daniel llamando al [phone] y presione la opcin 4 o envenos un mensaje a travs de Pharmacist, community.   No podemos decirle cul ser su copago por los medicamentos por adelantado ya que esto es diferente dependiendo de la cobertura de su seguro. Sin embargo, es posible que podamos encontrar un medicamento sustituto a Electrical engineer un formulario para que el seguro cubra el medicamento que se  considera necesario.   Si se requiere una autorizacin previa para que su compaa de seguros  cubra su medicamento, por favor permtanos de 1 a 2 das hbiles para completar este proceso.  Los precios de los medicamentos varan con frecuencia dependiendo del Environmental consultant de dnde se surte la receta y alguna farmacias pueden ofrecer precios ms baratos.  El sitio web www.goodrx.com tiene cupones para medicamentos de Airline pilot. Los precios aqu no tienen en cuenta lo que podra costar con la ayuda del seguro (puede ser ms barato con su seguro), pero el sitio web puede darle el precio si no utiliz Research scientist (physical sciences).  - Puede imprimir el cupn correspondiente y llevarlo con su receta a la farmacia.  - Tambin puede pasar por nuestra oficina durante el horario de atencin regular y Charity fundraiser una tarjeta de cupones de GoodRx.  - Si necesita que su receta se enve electrnicamente a una farmacia diferente, informe a nuestra oficina a travs de MyChart de [place] o por telfono llamando al [phone] y presione la opcin 4.

## 2022-09-26 NOTE — Progress Notes (Signed)
Follow-Up Visit   Subjective  Paula Hodges is a 67 y.o. female who presents for the following: Annual Exam.  The patient presents for Total-Body Skin Exam (TBSE) for skin cancer screening and mole check.  The patient has spots, moles and lesions to be evaluated, some may be new or changing. Patient has a history of Aks and treated cheeks and nose with 5FU/Calcipotriene cream 06/2022 with good reaction. No history of skin cancer.   The following portions of the chart were reviewed this encounter and updated as appropriate:       Review of Systems:  No other skin or systemic complaints except as noted in HPI or Assessment and Plan.  Objective  Well appearing patient in no apparent distress; mood and affect are within normal limits.  A full examination was performed including scalp, head, eyes, ears, nose, lips, neck, chest, axillae, abdomen, back, buttocks, bilateral upper extremities, bilateral lower extremities, hands, feet, fingers, toes, fingernails, and toenails. All findings within normal limits unless otherwise noted below.  Right Posterior Upper Arm 0.7 cm speckled medium light brown papule.   face Mid face erythema with telangiectasias.   eyebrows, upper lip Pink patches with greasy scale.   mid chest, back Dilated pores    Assessment & Plan  Skin cancer screening performed today.  Actinic Damage - chronic, secondary to cumulative UV radiation exposure/sun exposure over time - diffuse scaly erythematous macules with underlying dyspigmentation - Recommend daily broad spectrum sunscreen SPF 30+ to sun-exposed areas, reapply every 2 hours as needed.  - Recommend staying in the shade or wearing long sleeves, sun glasses (UVA+UVB protection) and wide brim hats (4-inch brim around the entire circumference of the hat). - Call for new or changing lesions.  Lentigines - Scattered tan macules - Due to sun exposure - Benign-appearing, observe - Recommend daily  broad spectrum sunscreen SPF 30+ to sun-exposed areas, reapply every 2 hours as needed. - Call for any changes  Seborrheic Keratoses - Stuck-on, waxy, tan-brown papules and/or plaques  - Benign-appearing - Discussed benign etiology and prognosis. - Observe - Call for any changes  Melanocytic Nevi - Tan-brown and/or pink-flesh-colored symmetric macules and papules - Benign appearing on exam today - Observation - Call clinic for new or changing moles - Recommend daily use of broad spectrum spf 30+ sunscreen to sun-exposed areas.   Hemangiomas - Red papules - Discussed benign nature - Observe - Call for any changes  History of PreCancerous Actinic Keratosis  - site(s) of PreCancerous Actinic Keratosis clear today s/p 5FU/VitD cream. - these may recur and new lesions may form requiring treatment to prevent transformation into skin cancer - observe for new or changing spots and contact [place] for appointment if occur - photoprotection with sun protective clothing; sunglasses and broad spectrum sunscreen with SPF of at least 30 + and frequent self skin exams recommended - yearly exams by a dermatologist recommended for persons with history of PreCancerous Actinic Keratoses - Improved after 5FU/Calcipotriene cream treatment.   Dermatofibroma - Firm pink/brown papulenodule with dimple sign - Benign appearing - Call for any changes  Nevus Right Posterior Upper Arm  Benign-appearing, stable.  Observation.  Call clinic for new or changing moles.  Recommend daily use of broad spectrum spf 30+ sunscreen to sun-exposed areas.   Rosacea face  Chronic and persistent condition with duration or expected duration over one year.  Not bothersome to patient at this time.  Rosacea is a chronic progressive skin condition  usually affecting the face of adults, causing redness and/or acne bumps. It is treatable but not curable. It sometimes affects the eyes (ocular rosacea) as well.  It may respond to topical and/or systemic medication and can flare with stress, sun exposure, alcohol, exercise, topical steroids (including hydrocortisone/cortisone 10) and some foods.  Daily application of broad spectrum spf 30+ sunscreen to face is recommended to reduce flares.  Discussed topical treatment, patient defers today. Not bothersome to patient.   Counseling for BBL / IPL / Laser and Coordination of Care Discussed the treatment option of Broad Band Light (BBL) /Intense Pulsed Light (IPL)/ Laser for skin discoloration, including brown spots and redness.  Typically we recommend at least 1-3 treatment sessions about 5-8 weeks apart for best results.  Cannot have tanned skin when BBL performed, and regular use of sunscreen is advised after the procedure to help maintain results. The patient's condition may also require "maintenance treatments" in the future.  The fee for BBL / laser treatments is $350 per treatment session for the whole face.  A fee can be quoted for other parts of the body.  Insurance typically does not pay for BBL/laser treatments and therefore the fee is an out-of-pocket cost.   Seborrheic dermatitis eyebrows, upper lip  Chronic and persistent condition with duration or expected duration over one year. Condition is symptomatic / bothersome to patient.  Mild.  Seborrheic Dermatitis  -  is a chronic persistent rash characterized by pinkness and scaling most commonly of the mid face but also can occur on the scalp (dandruff), ears; mid chest, mid back and groin.  It tends to be exacerbated by stress and cooler weather.  People who have neurologic disease may experience new onset or exacerbation of existing seborrheic dermatitis.  The condition is not curable but treatable and can be controlled.  Start OTC 1% hydrocortisone cream QD/BID prn flares.   Dilated pore of [place] mid chest, back  Benign, observe.     Return in about 1 year (around 09/27/2023) for TBSE, Hx  AKs.  IJamesetta Orleans, CMA, am acting as scribe for Brendolyn Patty, MD .  Documentation: I have reviewed the above documentation for accuracy and completeness, and I agree with the above.  Brendolyn Patty MD

## 2023-01-01 ENCOUNTER — Other Ambulatory Visit: Payer: Self-pay

## 2023-01-01 DIAGNOSIS — Z130 Encounter for screening for diseases of the blood and blood-forming organs and certain disorders involving the immune mechanism: Secondary | ICD-10-CM

## 2023-01-01 DIAGNOSIS — Z0000 Encounter for general adult medical examination without abnormal findings: Secondary | ICD-10-CM

## 2023-01-15 ENCOUNTER — Other Ambulatory Visit: Payer: Medicare PPO

## 2023-01-15 DIAGNOSIS — Z0000 Encounter for general adult medical examination without abnormal findings: Secondary | ICD-10-CM

## 2023-01-15 DIAGNOSIS — Z130 Encounter for screening for diseases of the blood and blood-forming organs and certain disorders involving the immune mechanism: Secondary | ICD-10-CM

## 2023-01-16 LAB — LIPID PANEL
Chol/HDL Ratio: 3.4 ratio (ref 0.0–4.4)
Cholesterol, Total: 196 mg/dL (ref 100–199)
HDL: 58 mg/dL (ref >39)
LDL Chol Calc (NIH): 129 mg/dL — ABNORMAL HIGH (ref 0–99)
Triglycerides: 46 mg/dL (ref 0–149)
VLDL Cholesterol Cal: 9 mg/dL (ref 5–40)

## 2023-01-16 LAB — COMPREHENSIVE METABOLIC PANEL
ALT: 10 IU/L (ref 0–32)
AST: 20 IU/L (ref 0–40)
Albumin: 4.3 g/dL (ref 3.9–4.9)
Alkaline Phosphatase: 74 IU/L (ref 44–121)
BUN/Creatinine Ratio: 17 (ref 12–28)
BUN: 16 mg/dL (ref 8–27)
Bilirubin Total: 0.4 mg/dL (ref 0.0–1.2)
CO2: 25 mmol/L (ref 20–29)
Calcium: 9 mg/dL (ref 8.7–10.3)
Chloride: 100 mmol/L (ref 96–106)
Creatinine, Ser: 0.92 mg/dL (ref 0.57–1.00)
Globulin, Total: 2.2 g/dL (ref 1.5–4.5)
Glucose: 90 mg/dL (ref 70–99)
Potassium: 4.9 mmol/L (ref 3.5–5.2)
Sodium: 140 mmol/L (ref 134–144)
Total Protein: 6.5 g/dL (ref 6.0–8.5)
eGFR: 69 mL/min/{1.73_m2} (ref >59)

## 2023-01-16 LAB — CBC
Hematocrit: 39.2 % (ref 34.0–46.6)
Hemoglobin: 13 g/dL (ref 11.1–15.9)
MCH: 29.9 pg (ref 26.6–33.0)
MCHC: 33.2 g/dL (ref 31.5–35.7)
MCV: 90 fL (ref 79–97)
Platelets: 172 10*3/uL (ref 150–450)
RBC: 4.35 x10E6/uL (ref 3.77–5.28)
RDW: 11.8 % (ref 11.7–15.4)
WBC: 4.1 10*3/uL (ref 3.4–10.8)

## 2023-01-16 LAB — HEMOGLOBIN A1C
Est. average glucose Bld gHb Est-mCnc: 111 mg/dL
Hgb A1c MFr Bld: 5.5 % (ref 4.8–5.6)

## 2023-01-16 LAB — TSH: TSH: 4.71 u[IU]/mL — ABNORMAL HIGH (ref 0.450–4.500)

## 2023-01-22 ENCOUNTER — Encounter: Payer: Self-pay | Admitting: Family Medicine

## 2023-01-22 ENCOUNTER — Ambulatory Visit (INDEPENDENT_AMBULATORY_CARE_PROVIDER_SITE_OTHER): Payer: Medicare PPO | Admitting: Family Medicine

## 2023-01-22 VITALS — BP 130/72 | HR 74 | Resp 18 | Ht 65.0 in | Wt 145.0 lb

## 2023-01-22 DIAGNOSIS — Z1159 Encounter for screening for other viral diseases: Secondary | ICD-10-CM

## 2023-01-22 DIAGNOSIS — Z780 Asymptomatic menopausal state: Secondary | ICD-10-CM

## 2023-01-22 DIAGNOSIS — R946 Abnormal results of thyroid function studies: Secondary | ICD-10-CM

## 2023-01-22 DIAGNOSIS — Z0000 Encounter for general adult medical examination without abnormal findings: Secondary | ICD-10-CM | POA: Diagnosis not present

## 2023-01-22 DIAGNOSIS — E559 Vitamin D deficiency, unspecified: Secondary | ICD-10-CM

## 2023-01-22 DIAGNOSIS — Z1212 Encounter for screening for malignant neoplasm of rectum: Secondary | ICD-10-CM

## 2023-01-22 DIAGNOSIS — E7800 Pure hypercholesterolemia, unspecified: Secondary | ICD-10-CM | POA: Diagnosis not present

## 2023-01-22 DIAGNOSIS — Z1211 Encounter for screening for malignant neoplasm of colon: Secondary | ICD-10-CM

## 2023-01-22 NOTE — Progress Notes (Signed)
Complete physical exam  Patient: Paula Hodges   DOB: 10-Dec-1955   67 y.o. Female  MRN: [id number]  Subjective:    Chief Complaint  Patient presents with   Annual Exam    Paula Hodges is a 67 y.o. female who presents today for a complete physical exam. She reports consuming a general diet. Home exercise routine includes weight lifting 5 days each week in addition to staying active [place] her [place]. She generally feels well. She reports sleeping fairly well other than having night sweats. She does not have additional problems to discuss today.    Most recent fall risk assessment:    01/22/2023    9:16 AM  Fall Risk   Falls in the past year? 0  Number falls in past yr: 0  Injury with Fall? 0  Risk for fall due to : No Fall Risks  Follow up Falls evaluation completed     Most recent depression and anxiety screenings:    01/22/2023    9:15 AM 08/24/2021    9:25 AM  PHQ 2/9 Scores  PHQ - 2 Score 0 0  PHQ- 9 Score 0 1      01/22/2023    9:16 AM 08/24/2021    9:25 AM 08/12/2021   10:59 AM  GAD 7 : Generalized Anxiety Score  Nervous, Anxious, on Edge 0 1 1  Control/stop worrying 0 0 0  Worry too much - different things 0 1 0  Trouble relaxing 0 1 0  Restless 0 1 0  Easily annoyed or irritable 0 0 0  Afraid - awful might happen 0 0 0  Total GAD 7 Score 0 4 1    Patient Active Problem List   Diagnosis Date Noted   Hypercholesterolemia without hypertriglyceridemia 05/26/2015   Vitamin D deficiency 05/26/2015    Past Surgical History:  Procedure Laterality Date   TONSILLECTOMY     Social History   Tobacco Use   Smoking status: Former    Years: 10    Types: Cigarettes    Quit date: 08/01/1979    Years since quitting: 43.5   Smokeless tobacco: Never  Vaping Use   Vaping Use: Never used  Substance Use Topics   Alcohol use: Not Currently    Alcohol/week: 0.0 standard drinks of alcohol   Drug use: No   Family History  Problem Relation Age of Onset   Lymphoma Mother     Lung cancer Father    Liver cancer Father    Hypertension Father    Hyperlipidemia Father    Obesity Sister    Hypertension Brother    Hyperlipidemia Brother    Diabetes Paternal Uncle    No Known Allergies   Patient Care Team: Carlean Jews, NP as PCP - General (Family Medicine) Midge Minium, MD as Consulting Physician (Gastroenterology)   Outpatient Medications Prior to Visit  Medication Sig   Omega-3 Fatty Acids (OMEGA III EPA+DHA PO) Take 1 tablet by mouth daily in the afternoon.   [DISCONTINUED] MULTIPLE VITAMIN PO Take by mouth. (Patient not taking: Reported on 09/20/2021)   [DISCONTINUED] VITAMIN D PO Take by mouth.   No facility-administered medications prior to visit.    Review of Systems  Constitutional:  Positive for diaphoresis (occasional night sweats). Negative for chills, fever and malaise/fatigue.  HENT:  Negative for congestion and hearing loss.   Eyes:  Negative for blurred vision and double vision.       Her eyes get tired on  some days  Respiratory:  Negative for cough and shortness of breath.   Cardiovascular:  Negative for chest pain, palpitations and leg swelling.  Gastrointestinal:  Negative for abdominal pain, constipation, diarrhea and heartburn.  Genitourinary:  Negative for frequency and urgency.  Musculoskeletal:  Negative for myalgias and neck pain.  Neurological:  Negative for headaches.  Psychiatric/Behavioral:  Negative for depression. The patient is not nervous/anxious and does not have insomnia.       Objective:    BP 130/72 (BP Location: Right Arm, Patient Position: Sitting, Cuff Size: Normal)   Pulse 74   Resp 18   Ht 5\' 5"  (1.651 m)   Wt 145 lb (65.8 kg)   SpO2 99%   BMI 24.13 kg/m    Physical Exam Constitutional:      General: She is not in acute distress.    Appearance: Normal appearance.  HENT:     Head: Normocephalic and atraumatic.     Right Ear: Tympanic membrane, ear canal and external ear normal. There is no  impacted cerumen.     Left Ear: Tympanic membrane, ear canal and external ear normal. There is no impacted cerumen.     Nose: Nose normal. No congestion.     Mouth/Throat:     Mouth: Mucous membranes are moist.     Pharynx: No oropharyngeal exudate or posterior oropharyngeal erythema.  Eyes:     Extraocular Movements: Extraocular movements intact.     Conjunctiva/sclera: Conjunctivae normal.     Pupils: Pupils are equal, round, and reactive to light.  Neck:     Thyroid: No thyroid mass, thyromegaly or thyroid tenderness.  Cardiovascular:     Rate and Rhythm: Normal rate and regular rhythm.     Heart sounds: Normal heart sounds. No murmur heard.    No friction rub. No gallop.  Pulmonary:     Effort: Pulmonary effort is normal. No respiratory distress.     Breath sounds: Normal breath sounds. No wheezing, rhonchi or rales.  Abdominal:     General: Abdomen is flat. Bowel sounds are normal. There is no distension.     Palpations: There is no mass.     Tenderness: There is no abdominal tenderness.  Musculoskeletal:        General: Normal range of motion.     Cervical back: Normal range of motion and neck supple.  Lymphadenopathy:     Cervical: No cervical adenopathy.  Skin:    General: Skin is warm and dry.  Neurological:     Mental Status: She is alert and oriented to person, place, and time.     Cranial Nerves: No cranial nerve deficit.     Motor: No weakness.     Deep Tendon Reflexes: Reflexes normal.  Psychiatric:        Mood and Affect: Mood normal.       Assessment & Plan:    Routine Health Maintenance and Physical Exam  Immunization History  Administered Date(s) Administered   Influenza,inj,Quad PF,6+ Mos 05/15/2019   Influenza-Unspecified 05/30/2015   PFIZER(Purple Top)SARS-COV-2 Vaccination 12/19/2019   Tdap 11/15/2011   Zoster Recombinat (Shingrix) 08/24/2021    Health Maintenance  Topic Date Due   Medicare Annual Wellness (AWV)  Never done   Hepatitis C  Screening  Never done   COVID-19 Vaccine (2 - Pfizer risk series) 01/09/2020   Pneumonia Vaccine 26+ Years old (1 of 1 - PCV) Never done   DEXA SCAN  01/23/2021   Zoster Vaccines- Shingrix (2 of  2) 10/19/2021   DTaP/Tdap/Td (2 - Td or Tdap) 11/14/2021   Fecal DNA (Cologuard)  06/08/2022   INFLUENZA VACCINE  03/01/2023   MAMMOGRAM  07/26/2024   HPV VACCINES  Aged Out   Colonoscopy  Discontinued   Needs second Shingles shot, updated Tdap, and PCV20 vaccine. Aware she will need to have them updated at local pharmacy. Agreeable to bone density scan, requests that referral be placed to the same location her mammograms are done at.  Also agreeable to repeat Cologuard. Reviewed recent lab work, rechecking thyroid function labs given elevated TSH.  Discussed health benefits of physical activity, and encouraged her to engage in regular exercise appropriate for her age and condition.  Wellness examination  Abnormal thyroid function test -     TSH; Future -     T4, free; Future -     T3, free; Future  Postmenopausal estrogen deficiency -     VITAMIN D 25 Hydroxy (Vit-D Deficiency, Fractures); Future -     DG Bone Density; Future  Hypercholesterolemia without hypertriglyceridemia Assessment & Plan: Lipid panel: LDL 129, HDL 58, triglycerides 46. The 10-year ASCVD risk score (Arnett DK, et al., 2019) is: 6.2%.  Encouraged heart healthy diet low in trans and saturated fats and to continue active lifestyle.  Will continue to monitor every 6 to 12 months.   Screening for colorectal cancer -     Cologuard  Screening for viral disease -     Hepatitis C antibody; Future  Vitamin D deficiency Assessment & Plan: Rechecking vitamin D today.  Encouraged adequate vitamin D and calcium intake postmenopause, provided additional information in AVS.  Orders: -     VITAMIN D 25 Hydroxy (Vit-D Deficiency, Fractures); Future    Return in about 1 year (around 01/22/2024) for annual physical, fasting  blood work 1 week before.     Melida Quitter, PA

## 2023-01-22 NOTE — Patient Instructions (Addendum)
PREVENTATIVE CARE: -At the pharmacy, you will need:        -Tetanus booster (Tdap)        -Second Shingles vaccine (Shingrix)        -Pneumonia vaccine (PCV20) -Continue weight lifting and ensuring you have adequate vitamin D and calcium intake each day. This is exactly what you should be doing to keep your bones strong!        -Current recommendations: calcium-total of 1200 mg of calcium daily.  If you eat a very calcium rich diet you may be able to obtain that without a supplement.  If not, then I recommend calcium 500 mg twice a day.  There are several products over-the-counter such as Caltrate D and Viactiv chews which are great options that contain calcium and vitamin D.        -vitamin D-recommend 800 international units daily

## 2023-01-22 NOTE — Assessment & Plan Note (Signed)
Rechecking vitamin D today.  Encouraged adequate vitamin D and calcium intake postmenopause, provided additional information in AVS.

## 2023-01-22 NOTE — Assessment & Plan Note (Signed)
Lipid panel: LDL 129, HDL 58, triglycerides 46. The 10-year ASCVD risk score (Arnett DK, et al., 2019) is: 6.2%.  Encouraged heart healthy diet low in trans and saturated fats and to continue active lifestyle.  Will continue to monitor every 6 to 12 months.

## 2023-01-23 LAB — VITAMIN D 25 HYDROXY (VIT D DEFICIENCY, FRACTURES): Vit D, 25-Hydroxy: 43.5 ng/mL (ref 30.0–100.0)

## 2023-01-23 LAB — HEPATITIS C ANTIBODY: Hep C Virus Ab: NONREACTIVE

## 2023-01-23 LAB — T4, FREE: Free T4: 1.07 ng/dL (ref 0.82–1.77)

## 2023-01-23 LAB — TSH: TSH: 4.01 u[IU]/mL (ref 0.450–4.500)

## 2023-01-23 LAB — T3, FREE: T3, Free: 2.7 pg/mL (ref 2.0–4.4)

## 2023-01-29 NOTE — Progress Notes (Signed)
Labs are stable.  No medication changes at this time.

## 2023-03-06 ENCOUNTER — Ambulatory Visit: Payer: Medicare PPO

## 2023-03-27 ENCOUNTER — Ambulatory Visit: Payer: Medicare PPO

## 2023-03-29 ENCOUNTER — Ambulatory Visit (INDEPENDENT_AMBULATORY_CARE_PROVIDER_SITE_OTHER): Payer: Medicare PPO

## 2023-03-29 VITALS — Ht 65.0 in | Wt 145.0 lb

## 2023-03-29 DIAGNOSIS — Z0000 Encounter for general adult medical examination without abnormal findings: Secondary | ICD-10-CM | POA: Diagnosis not present

## 2023-03-29 NOTE — Progress Notes (Signed)
Subjective:   Paula Hodges is a 67 y.o. female who presents for Medicare Annual (Subsequent) preventive examination.  Visit Complete: Virtual  I connected with  Paula Hodges on 03/29/23 by a audio enabled telemedicine application and verified that I am speaking with the correct person using two identifiers.  Patient Location: Home  Provider Location: Home Office  I discussed the limitations of evaluation and management by telemedicine. The patient expressed understanding and agreed to proceed.  Patient Medicare AWV questionnaire was completed by the patient on 03/29/23; I have confirmed that all information answered by patient is correct and no changes since this date.  Review of Systems    Vital Signs: Unable to obtain new vitals due to this being a telehealth visit.  Cardiac Risk Factors include: advanced age (>83men, >62 women)     Objective:    Today's Vitals   03/29/23 1337  Weight: 145 lb (65.8 kg)  Height: 5\' 5"  (1.651 m)   Body mass index is 24.13 kg/m.     03/29/2023    1:44 PM 03/12/2019    2:19 PM 12/12/2016    9:12 AM 07/14/2015    9:09 AM  Advanced Directives  Does Patient Have a Medical Advance Directive? Yes Yes Yes Yes  Type of Estate agent of [place];Living will Healthcare Power of [place];Living will  Healthcare Power of [place];Living will  Does patient want to make changes to medical advance directive?    No - Patient declined  Copy of Healthcare Power of Attorney in Chart? No - copy requested       Current Medications (verified) Outpatient Encounter Medications as of 03/29/2023  Medication Sig   Omega-3 Fatty Acids (OMEGA III EPA+DHA PO) Take 1 tablet by mouth daily in the afternoon.   No facility-administered encounter medications on file as of 03/29/2023.    Allergies (verified) Patient has no known allergies.   History: Past Medical History:  Diagnosis Date   Actinic keratosis    Hyperlipidemia    Past  Surgical History:  Procedure Laterality Date   TONSILLECTOMY     Family History  Problem Relation Age of Onset   Lymphoma Mother    Lung cancer Father    Liver cancer Father    Hypertension Father    Hyperlipidemia Father    Obesity Sister    Hypertension Brother    Hyperlipidemia Brother    Diabetes Paternal Uncle    Social History   Socioeconomic History   Marital status: Married    Spouse name: Not on file   Number of children: Not on file   Years of education: Not on file   Highest education level: Not on file  Occupational History   Not on file  Tobacco Use   Smoking status: Former    Current packs/day: 0.00    Types: Cigarettes    Start date: 07/31/1969    Quit date: 08/01/1979    Years since quitting: 43.6   Smokeless tobacco: Never  Vaping Use   Vaping status: Never Used  Substance and Sexual Activity   Alcohol use: Not Currently    Alcohol/week: 0.0 standard drinks of alcohol   Drug use: No   Sexual activity: Yes    Birth control/protection: None  Other Topics Concern   Not on file  Social History Narrative   Not on file   Social Determinants of Health   Financial Resource Strain: Low Risk  (03/29/2023)   Overall Financial Resource Strain (CARDIA)  Difficulty of Paying Living Expenses: Not very hard  Food Insecurity: No Food Insecurity (03/29/2023)   Hunger Vital Sign    Worried About Running Out of Food in the Last Year: Never true    Ran Out of Food in the Last Year: Never true  Transportation Needs: No Transportation Needs (03/29/2023)   PRAPARE - Administrator, Civil Service (Medical): No    Lack of Transportation (Non-Medical): No  Physical Activity: Sufficiently Active (03/29/2023)   Exercise Vital Sign    Days of Exercise per Week: 6 days    Minutes of Exercise per Session: 50 min  Stress: No Stress Concern Present (03/29/2023)   [employer] of Occupational Health - Occupational Stress Questionnaire    Feeling of Stress :  Only a little  Social Connections: Unknown (03/29/2023)   Social Connection and Isolation Panel [NHANES]    Frequency of Communication with Friends and Family: More than three times a week    Frequency of Social Gatherings with Friends and Family: More than three times a week    Attends Religious Services: Not on Marketing executive or Organizations: Yes    Attends Engineer, structural: More than 4 times per year    Marital Status: Married    Tobacco Counseling Counseling given: Not Answered   Clinical Intake:  Pre-visit preparation completed: Yes  Pain : No/denies pain     BMI - recorded: 24.13 Nutritional Status: BMI of 19-24  Normal Nutritional Risks: None Diabetes: No  How often do you need to have someone help you when you read instructions, pamphlets, or other written materials from your doctor or pharmacy?: 2 - Rarely  Interpreter Needed?: No  Information entered by :: Theresa Mulligan LPN   Activities of Daily Living    03/29/2023    8:40 AM  In your present state of health, do you have any difficulty performing the following activities:  Hearing? 0  Vision? 0  Difficulty concentrating or making decisions? 0  Walking or climbing stairs? 0  Dressing or bathing? 0  Doing errands, shopping? 0  Preparing Food and eating ? N  Using the Toilet? N  In the past six months, have you accidently leaked urine? N  Do you have problems with loss of bowel control? N  Managing your Medications? N  Managing your Finances? N  Housekeeping or managing your Housekeeping? N    Patient Care Team: Melida Quitter, PA as PCP - General (Family Medicine) Midge Minium, MD as Consulting Physician (Gastroenterology)  Indicate any recent Medical Services you may have received from other than [facility] providers in the past year (date may be approximate).     Assessment:   This is a routine wellness examination for Paula Hodges.  Hearing/Vision screen Hearing  Screening - Comments:: Denies hearing difficulties   Vision Screening - Comments:: Wears rx glasses - up to date with [facility] with  [facility]  Dietary issues and exercise activities discussed:     Goals Addressed               This Visit's Progress     Increase physical activity (pt-stated)        Eat healthy and stay moving.       Depression Screen    03/29/2023    1:41 PM 01/22/2023    9:15 AM 08/24/2021    9:25 AM 08/12/2021   10:59 AM 10/11/2020    2:32 PM  05/15/2019    8:23 AM 03/12/2019    2:19 PM  PHQ 2/9 Scores  PHQ - 2 Score 0 0 0 0 0 0 0  PHQ- 9 Score  0 1 0 1 0 0    Fall Risk    03/29/2023    8:40 AM 01/22/2023    9:16 AM 08/24/2021    9:25 AM 08/12/2021   10:59 AM 10/11/2020    2:31 PM  Fall Risk   Falls in the past year? 0 0 0 0 0  Number falls in past yr: 0 0 0 0 0  Injury with Fall? 0 0 0 0   Risk for fall due to :  No Fall Risks   No Fall Risks  Follow up  Falls evaluation completed Falls evaluation completed Falls evaluation completed     MEDICARE RISK AT HOME: Medicare Risk at Home Any stairs in or around the home?: Yes If so, are there any without handrails?: No Home free of loose throw rugs in walkways, pet beds, electrical cords, etc?: Yes Adequate lighting in your home to reduce risk of falls?: Yes Life alert?: No Use of a cane, walker or w/c?: No Grab bars in the bathroom?: No Shower chair or bench in shower?: Yes Elevated toilet seat or a handicapped toilet?: No  TIMED UP AND GO:  Was the test performed?  No    Cognitive Function:        03/29/2023    1:44 PM  6CIT Screen  What Year? 0 points  What month? 0 points  What time? 0 points  Count back from 20 0 points  Months in reverse 0 points  Repeat phrase 0 points  Total Score 0 points    Immunizations Immunization History  Administered Date(s) Administered   Influenza,inj,Quad PF,6+ Mos 05/15/2019   Influenza-Unspecified 05/30/2015   PFIZER(Purple  Top)SARS-COV-2 Vaccination 12/19/2019   Tdap 11/15/2011   Zoster Recombinant(Shingrix) 08/24/2021    TDAP status: Due, Education has been provided regarding the importance of this vaccine. Advised may receive this vaccine at local pharmacy or Health Dept. Aware to provide a copy of the vaccination record if obtained from local pharmacy or Health Dept. Verbalized acceptance and understanding.  Flu Vaccine status: Due, Education has been provided regarding the importance of this vaccine. Advised may receive this vaccine at local pharmacy or Health Dept. Aware to provide a copy of the vaccination record if obtained from local pharmacy or Health Dept. Verbalized acceptance and understanding.  Pneumococcal vaccine status: Declined,  Education has been provided regarding the importance of this vaccine but patient still declined. Advised may receive this vaccine at local pharmacy or Health Dept. Aware to provide a copy of the vaccination record if obtained from local pharmacy or Health Dept. Verbalized acceptance and understanding.   Covid-19 vaccine status: Declined, Education has been provided regarding the importance of this vaccine but patient still declined. Advised may receive this vaccine at local pharmacy or Health Dept.or vaccine clinic. Aware to provide a copy of the vaccination record if obtained from local pharmacy or Health Dept. Verbalized acceptance and understanding.  Qualifies for Shingles Vaccine? Yes   Zostavax completed No   Shingrix Completed?: No.    Education has been provided regarding the importance of this vaccine. Patient has been advised to call insurance company to determine out of pocket expense if they have not yet received this vaccine. Advised may also receive vaccine at local pharmacy or Health Dept. Verbalized acceptance and understanding.  Screening Tests Health Maintenance  Topic Date Due   COVID-19 Vaccine (2 - Pfizer risk series) 01/09/2020   Pneumonia Vaccine 39+  Years old (1 of 1 - PCV) Never done   DEXA SCAN  01/23/2021   Zoster Vaccines- Shingrix (2 of 2) 10/19/2021   DTaP/Tdap/Td (2 - Td or Tdap) 11/14/2021   INFLUENZA VACCINE  03/01/2023   Medicare Annual Wellness (AWV)  03/28/2024   MAMMOGRAM  07/26/2024   Fecal DNA (Cologuard)  02/18/2026   Hepatitis C Screening  Completed   HPV VACCINES  Aged Out   Colonoscopy  Discontinued    Health Maintenance  Health Maintenance Due  Topic Date Due   COVID-19 Vaccine (2 - Pfizer risk series) 01/09/2020   Pneumonia Vaccine 67+ Years old (1 of 1 - PCV) Never done   DEXA SCAN  01/23/2021   Zoster Vaccines- Shingrix (2 of 2) 10/19/2021   DTaP/Tdap/Td (2 - Td or Tdap) 11/14/2021   INFLUENZA VACCINE  03/01/2023    Colorectal cancer screening: Type of screening: Cologuard. Completed 02/19/23. Repeat every 3 years  Mammogram status: Completed 07/26/22. Repeat every year  Bone Density status: Ordered Scheduledfor 04/10/23. Pt provided with contact info and advised to call to schedule appt.  Lung Cancer Screening: (Low Dose CT Chest recommended if Age 29-80 years, 20 pack-year currently smoking OR have quit w/in 15years.) does not qualify.     Additional Screening:  Hepatitis C Screening: does qualify; Completed 01/23/23  Vision Screening: Recommended annual ophthalmology exams for early detection of glaucoma and other disorders of the eye. Is the patient up to date with their annual eye exam?  Yes  Who is the provider or what is the name of the office in which the patient attends annual eye exams? [facility] If pt is not established with a provider, would they like to be referred to a provider to establish care? No .   Dental Screening: Recommended annual dental exams for proper oral hygiene   Community Resource Referral / Chronic Care Management:  CRR required this visit?  No   CCM required this visit?  No     Plan:     I have personally reviewed and noted the following in the  patient's chart:   Medical and social history Use of alcohol, tobacco or illicit drugs  Current medications and supplements including opioid prescriptions. Patient is not currently taking opioid prescriptions. Functional ability and status Nutritional status Physical activity Advanced directives List of other physicians Hospitalizations, surgeries, and ER visits in previous 12 months Vitals Screenings to include cognitive, depression, and falls Referrals and appointments  In addition, I have reviewed and discussed with patient certain preventive protocols, quality metrics, and best practice recommendations. A written personalized care plan for preventive services as well as general preventive health recommendations were provided to patient.     Tillie Rung, LPN   1/61/0960   After Visit Summary: (MyChart) Due to this being a telephonic visit, the after visit summary with patients personalized plan was offered to patient via MyChart   Nurse Notes: None

## 2023-03-29 NOTE — Patient Instructions (Addendum)
Paula Hodges , Thank you for taking time to come for your Medicare Wellness Visit. I appreciate your ongoing commitment to your health goals. Please review the following plan we discussed and let me know if I can assist you in the future.   Referrals/Orders/Follow-Ups/Clinician Recommendations:   This is a list of the screening recommended for you and due dates:  Health Maintenance  Topic Date Due   COVID-19 Vaccine (2 - Pfizer risk series) 01/09/2020   Pneumonia Vaccine (1 of 1 - PCV) Never done   DEXA scan (bone density measurement)  01/23/2021   Zoster (Shingles) Vaccine (2 of 2) 10/19/2021   DTaP/Tdap/Td vaccine (2 - Td or Tdap) 11/14/2021   Flu Shot  03/01/2023   Medicare Annual Wellness Visit  03/28/2024   Mammogram  07/26/2024   Cologuard (Stool DNA test)  02/18/2026   Hepatitis C Screening  Completed   HPV Vaccine  Aged Out   Colon Cancer Screening  Discontinued    Advanced directives: (Copy Requested) Please bring a copy of your health care power of attorney and living will to the office to be added to your chart at your convenience.  Next Medicare Annual Wellness Visit scheduled for next year: Yes

## 2023-04-10 ENCOUNTER — Ambulatory Visit
Admission: RE | Admit: 2023-04-10 | Discharge: 2023-04-10 | Disposition: A | Discharge status: Home / Self Care | Payer: Medicare PPO | Source: Ambulatory Visit | Attending: Family Medicine | Admitting: Family Medicine

## 2023-04-10 DIAGNOSIS — Z780 Asymptomatic menopausal state: Secondary | ICD-10-CM | POA: Diagnosis present

## 2023-06-05 ENCOUNTER — Encounter: Payer: Self-pay | Admitting: Family Medicine

## 2023-06-05 ENCOUNTER — Ambulatory Visit: Payer: Medicare PPO | Admitting: Family Medicine

## 2023-06-05 VITALS — BP 133/79 | HR 77 | Resp 18 | Ht 65.0 in | Wt 150.0 lb

## 2023-06-05 DIAGNOSIS — R109 Unspecified abdominal pain: Secondary | ICD-10-CM

## 2023-06-05 DIAGNOSIS — R319 Hematuria, unspecified: Secondary | ICD-10-CM

## 2023-06-05 DIAGNOSIS — R310 Gross hematuria: Secondary | ICD-10-CM | POA: Diagnosis not present

## 2023-06-05 LAB — POCT URINALYSIS DIP (CLINITEK)
Bilirubin, UA: NEGATIVE
Blood, UA: NEGATIVE
Glucose, UA: NEGATIVE mg/dL
Ketones, POC UA: NEGATIVE mg/dL
Leukocytes, UA: NEGATIVE
Nitrite, UA: NEGATIVE
POC PROTEIN,UA: NEGATIVE
Spec Grav, UA: 1.01 (ref 1.010–1.025)
Urobilinogen, UA: 0.2 U/dL
pH, UA: 6 (ref 5.0–8.0)

## 2023-06-05 NOTE — Progress Notes (Signed)
Acute Office Visit  Subjective:     Patient ID: Paula Hodges, female    DOB: [birth year], 67 y.o.   MRN: [id number]  Chief Complaint  Patient presents with   Hematuria    HPI Patient is in today for possible UTI.  States that abdominal pressure and increased urinary frequency started 4 days ago.  She woke up around 1 AM to use the restroom.  When she did, she noticed bright red urine on the tissue when she wiped as well as in the toilet.  There were no clots.  She describes a tingling sensation while urinating that lasted for several more hours. Patient complains of hematuria and discomfort with urination, but no pain or burning.  Patient denies back pain, fever, headache, sorethroat, and vaginal discharge. Patient does not have a history of recurrent UTI.  Patient does not have a history of pyelonephritis.  Denies fever, chills, abdominal pain, flank pain, nausea/vomiting, diarrhea/constipation, malodorous urine.  She has never experienced this before.  She does have a history of smoking, no history of urinary issues.  ROS Negative unless otherwise noted in HPI    Objective:    BP 133/79 (BP Location: Left Arm, Patient Position: Sitting, Cuff Size: Normal)   Pulse 77   Resp 18   Ht 5\' 5"  (1.651 m)   Wt 150 lb (68 kg)   SpO2 100%   BMI 24.96 kg/m   Physical Exam Constitutional:      General: She is not in acute distress.    Appearance: Normal appearance.  HENT:     Head: Normocephalic and atraumatic.  Cardiovascular:     Rate and Rhythm: Normal rate and regular rhythm.     Heart sounds: No murmur heard.    No friction rub. No gallop.  Pulmonary:     Effort: Pulmonary effort is normal. No respiratory distress.     Breath sounds: No wheezing, rhonchi or rales.  Abdominal:     General: Abdomen is flat. Bowel sounds are normal. There is no distension.     Palpations: Abdomen is soft. There is no mass.     Tenderness: There is no abdominal tenderness. There is no right CVA  tenderness, left CVA tenderness or guarding.  Skin:    General: Skin is warm and dry.  Neurological:     Mental Status: She is alert and oriented to person, place, and time.    Results for orders placed or performed in visit on 06/05/23  POCT URINALYSIS DIP (CLINITEK)  Result Value Ref Range   Color, UA light yellow (A) yellow   Clarity, UA clear clear   Glucose, UA negative negative mg/dL   Bilirubin, UA negative negative   Ketones, POC UA negative negative mg/dL   Spec Grav, UA 8.413 2.440 - 1.025   Blood, UA negative negative   pH, UA 6.0 5.0 - 8.0   POC PROTEIN,UA negative negative, trace   Urobilinogen, UA 0.2 0.2 or 1.0 E.U./dL   Nitrite, UA Negative Negative   Leukocytes, UA Negative Negative      Assessment & Plan:  Homero Fellers hematuria -     POCT URINALYSIS DIP (CLINITEK) -     Basic metabolic panel; Future -     CBC with Differential/Platelet; Future -     CT ABDOMEN PELVIS WO CONTRAST; Future  Abdominal pain, unspecified abdominal location -     CT ABDOMEN PELVIS WO CONTRAST; Future  Painless hematuria -     CT ABDOMEN PELVIS  WO CONTRAST; Future  Urinalysis negative for acute cystitis or hematuria.  Checking electrolytes and renal function with BMP, checking for signs of infection with CBC.  Also starting noncontrast CT to evaluate for potential nephrolithiasis.  Return if symptoms worsen or fail to improve.  Melida Quitter, PA

## 2023-06-06 LAB — CBC WITH DIFFERENTIAL/PLATELET
Basophils Absolute: 0 10*3/uL (ref 0.0–0.2)
Basos: 1 %
EOS (ABSOLUTE): 0.1 10*3/uL (ref 0.0–0.4)
Eos: 1 %
Hematocrit: 43.1 % (ref 34.0–46.6)
Hemoglobin: 13.8 g/dL (ref 11.1–15.9)
Immature Grans (Abs): 0 10*3/uL (ref 0.0–0.1)
Immature Granulocytes: 0 %
Lymphocytes Absolute: 1.6 10*3/uL (ref 0.7–3.1)
Lymphs: 28 %
MCH: 30.4 pg (ref 26.6–33.0)
MCHC: 32 g/dL (ref 31.5–35.7)
MCV: 95 fL (ref 79–97)
Monocytes Absolute: 0.5 10*3/uL (ref 0.1–0.9)
Monocytes: 8 %
Neutrophils Absolute: 3.5 10*3/uL (ref 1.4–7.0)
Neutrophils: 62 %
Platelets: 200 10*3/uL (ref 150–450)
RBC: 4.54 x10E6/uL (ref 3.77–5.28)
RDW: 11.8 % (ref 11.7–15.4)
WBC: 5.6 10*3/uL (ref 3.4–10.8)

## 2023-06-06 LAB — BASIC METABOLIC PANEL
BUN/Creatinine Ratio: 23 (ref 12–28)
BUN: 20 mg/dL (ref 8–27)
CO2: 23 mmol/L (ref 20–29)
Calcium: 9.5 mg/dL (ref 8.7–10.3)
Chloride: 100 mmol/L (ref 96–106)
Creatinine, Ser: 0.86 mg/dL (ref 0.57–1.00)
Glucose: 95 mg/dL (ref 70–99)
Potassium: 5.2 mmol/L (ref 3.5–5.2)
Sodium: 138 mmol/L (ref 134–144)
eGFR: 74 mL/min/{1.73_m2} (ref >59)

## 2023-08-23 ENCOUNTER — Encounter: Payer: Self-pay | Admitting: Obstetrics

## 2023-08-23 ENCOUNTER — Ambulatory Visit: Payer: Medicare PPO | Admitting: Obstetrics

## 2023-08-23 VITALS — BP 140/80 | Ht 65.0 in | Wt 153.0 lb

## 2023-08-23 DIAGNOSIS — N951 Menopausal and female climacteric states: Secondary | ICD-10-CM | POA: Diagnosis not present

## 2023-08-23 NOTE — Progress Notes (Signed)
GYNECOLOGY: NEW PATIENT  Subjective:    PCP: Melida Quitter, PA Paula Hodges is a 68 y.o. female [phone] who presents for hot flashes, wakes up almost every night a few times and can't go back to sleep, is disruptive. No prior use of HRT, still has her uterus and ovaries, denies FMH of breast, uterine or ovarian cancers, no hx of migranes or VTE and does not smoke.   GYN HISTORY:  No LMP recorded. Patient is postmenopausal.     Menstrual History: OB History     Gravida  2   Para  2   Term  2   Preterm      AB      Living  2      SAB      IAB      Ectopic      Multiple      Live Births              Menarche age: 58 No LMP recorded. Patient is postmenopausal.   Intermenstrual bleeding, spotting, or discharge? no Urinary incontinence? no  Sexually active: yes Number of sexual partners: 1 Gender of sexual Partners: male  Social History   Substance and Sexual Activity  Sexual Activity Yes   Birth control/protection: None  Dyspareunia? no STI history: no STI/HIV testing or immunizations needed? No.   Past Medical History:  Diagnosis Date   Actinic keratosis    Hyperlipidemia    Past Surgical History:  Procedure Laterality Date   TONSILLECTOMY      Family History  Problem Relation Age of Onset   Lymphoma Mother    Lung cancer Father    Liver cancer Father    Hypertension Father    Hyperlipidemia Father    Obesity Sister    Hypertension Brother    Hyperlipidemia Brother    Diabetes Paternal Uncle    Social History   Tobacco Use   Smoking status: Former    Current packs/day: 0.00    Types: Cigarettes    Start date: 07/31/1969    Quit date: 08/01/1979    Years since quitting: 44.0    Passive exposure: Past   Smokeless tobacco: Never  Substance Use Topics   Alcohol use: Not Currently    Alcohol/week: 0.0 standard drinks of alcohol    Current Outpatient Medications  Medication Sig Dispense Refill   Omega-3 Fatty Acids (OMEGA  III EPA+DHA PO) Take 1 tablet by mouth daily in the afternoon.     No current facility-administered medications for this visit.   No Known Allergies  Review Of Systems  Constitutional: Denied constitutional symptoms, night sweats, recent illness, fatigue, fever, insomnia and weight loss.  Eyes: Denied eye symptoms, eye pain, photophobia, vision change and visual disturbance.  Ears/Nose/Throat/Neck: Denied ear, nose, throat or neck symptoms, hearing loss, nasal discharge, sinus congestion and sore throat.  Cardiovascular: Denied cardiovascular symptoms, arrhythmia, chest pain/pressure, edema, exercise intolerance, orthopnea and palpitations.  Respiratory: Denied pulmonary symptoms, asthma, pleuritic pain, productive sputum, cough, dyspnea and wheezing.  Gastrointestinal: Denied, gastro-esophageal reflux, melena, nausea and vomiting.  Genitourinary: Denied genitourinary symptoms including symptomatic vaginal discharge, pelvic relaxation issues, and urinary complaints.  Musculoskeletal: Denied musculoskeletal symptoms, stiffness, swelling, muscle weakness and myalgia.  Dermatologic: Denied dermatology symptoms, rash and scar.  Neurologic: Hot flashes  Psychiatric: Denied psychiatric symptoms, anxiety and depression.  Endocrine: Denied endocrine symptoms including hot flashes and night sweats.      Objective:    BP (!) 140/80  Ht 5\' 5"  (1.651 m)   Wt 153 lb (69.4 kg)   BMI 25.46 kg/m   Constitutional: Well-developed, well-nourished female in no acute distress Neurological: Alert and oriented to person, place, and time Psychiatric: Mood and affect appropriate Skin: No rashes or lesions Respiratory: Normal work of breathing.    Assessment/Plan:    Paula Hodges is a 68 y.o. female [phone] with vasomotor symptoms of menopause.  We discussed first-line treatment of hormone replacement, but also reviewed options of other medications and supplements. Pt states she prefers to avoid  medications and would like to start first with black cohosh. Importance of purchasing from reputable source discussed. Cannot make recommendations on doses or overall efficacy due to lack of FDA approval.   Return in about 3 months (around 11/21/2023) for F/u hot flashes.    Julieanne Manson, DO [facility]

## 2023-09-06 ENCOUNTER — Encounter: Payer: Self-pay | Admitting: Family Medicine

## 2023-09-25 ENCOUNTER — Encounter: Payer: Medicare PPO | Admitting: Dermatology

## 2024-01-10 ENCOUNTER — Telehealth: Payer: Self-pay | Admitting: *Deleted

## 2024-01-10 NOTE — Telephone Encounter (Signed)
 LVM for pt to call office wanted to see about rescheduling her physical that was with morgan and then rescheduling the lab to the week prior to that appt. Also, it looks like she is going to be establishing care somewhere else, so she may want not want to reschedule and if so we can cancel the lab appt.

## 2024-01-15 ENCOUNTER — Other Ambulatory Visit: Payer: Medicare PPO

## 2024-01-22 ENCOUNTER — Encounter: Payer: Medicare PPO | Admitting: Family Medicine

## 2024-03-03 ENCOUNTER — Ambulatory Visit: Admitting: Family Medicine

## 2024-03-03 ENCOUNTER — Encounter: Payer: Self-pay | Admitting: Family Medicine

## 2024-03-03 VITALS — BP 150/71 | HR 65 | Ht 65.0 in | Wt 154.0 lb

## 2024-03-03 DIAGNOSIS — N182 Chronic kidney disease, stage 2 (mild): Secondary | ICD-10-CM

## 2024-03-03 DIAGNOSIS — Z1231 Encounter for screening mammogram for malignant neoplasm of breast: Secondary | ICD-10-CM

## 2024-03-03 DIAGNOSIS — Z0001 Encounter for general adult medical examination with abnormal findings: Secondary | ICD-10-CM | POA: Diagnosis not present

## 2024-03-03 DIAGNOSIS — E782 Mixed hyperlipidemia: Secondary | ICD-10-CM | POA: Diagnosis not present

## 2024-03-03 DIAGNOSIS — E559 Vitamin D deficiency, unspecified: Secondary | ICD-10-CM

## 2024-03-03 DIAGNOSIS — Z0000 Encounter for general adult medical examination without abnormal findings: Secondary | ICD-10-CM

## 2024-03-03 DIAGNOSIS — R7989 Other specified abnormal findings of blood chemistry: Secondary | ICD-10-CM | POA: Diagnosis not present

## 2024-03-03 NOTE — Patient Instructions (Addendum)
 Please call the [facility] [phone]) to schedule your routine screening mammogram, which is due in December 2025.

## 2024-03-03 NOTE — Progress Notes (Signed)
 New patient visit   Patient: Paula Hodges   DOB: 1956-01-21   68 y.o. Female  MRN: [id number] Visit Date: 03/03/2024  Today's healthcare provider: LAURAINE LOISE BUOY, DO   Chief Complaint  Patient presents with   New Patient (Initial Visit)    Patient reports she is present to establish care with no concerns to address. Patient declined pneumonia vaccine and receiving 2nd shingles vaccine today.    Subjective    Paula Hodges is a 68 y.o. female who presents today as a new patient to establish care.  HPI HPI     New Patient (Initial Visit)    Additional comments: Patient reports she is present to establish care with no concerns to address. Patient declined pneumonia vaccine and receiving 2nd shingles vaccine today.       Last edited by Lilian Fitzpatrick, CMA on 03/03/2024 10:01 AM.       Paula Hodges is a 68 year old female who presents for an annual physical exam.  She has a history of high cholesterol and vitamin D  deficiency. Her last blood work in June of the previous year showed normal vitamin D  levels (43). She typically takes a vitamin D  supplement in the winter but not during the summer months.  [place] is regularly outside and active during the summer.  She has experienced a couple of abnormal thyroid levels in the past, but they were inconsistent, only mildly elevated, and she has not had any symptoms related to thyroid issues.   She is a former smoker, having quit over forty years ago. She maintains a healthy lifestyle, avoiding processed foods and exercising regularly, including working out five days a week and gardening. Her blood pressure at home is typically around 112/68.  She has not received the flu or COVID vaccines and prefers not to get them. She also declined the pneumonia and second shingles vaccines. Her last tetanus vaccine was recent, in 2024.     Past Medical History:  Diagnosis Date   Actinic keratosis     Hyperlipidemia    Past Surgical History:  Procedure Laterality Date   TONSILLECTOMY     Family Status  Relation Name Status   Mother Sherrilyn Deceased   Father Ezzard zettle Deceased   Sister Cheri Alive   Brother Richard Alive   Brother 2 Alive   Pat Scientist, research (life sciences) (Not Specified)  No partnership data on file   Family History  Problem Relation Age of Onset   Lymphoma Mother    Varicose Veins Mother    Lung cancer Father    Liver cancer Father    Hypertension Father    Hyperlipidemia Father    Obesity Sister    Hypertension Brother    Hyperlipidemia Brother    Diabetes Paternal Uncle    Social History   Socioeconomic History   Marital status: Married    Spouse name: Not on file   Number of children: Not on file   Years of education: Not on file   Highest education level: Not on file  Occupational History   Not on file  Tobacco Use   Smoking status: Former    Current packs/day: 0.00    Types: Cigarettes    Start date: 07/31/1969    Quit date: 08/01/1979    Years since quitting: 44.6    Passive exposure: Past   Smokeless tobacco: Never  Vaping Use   Vaping status: Never Used  Substance and Sexual Activity   Alcohol use: Not Currently    Alcohol/week: 0.0 standard drinks of alcohol   Drug use: No   Sexual activity: Yes    Birth control/protection: None  Other Topics Concern   Not on file  Social History Narrative   Not on file   Social Drivers of Health   Financial Resource Strain: Low Risk  (03/29/2023)   Overall Financial Resource Strain (CARDIA)    Difficulty of Paying Living Expenses: Not very hard  Food Insecurity: No Food Insecurity (03/29/2023)   Hunger Vital Sign    Worried About Running Out of Food in the Last Year: Never true    Ran Out of Food in the Last Year: Never true  Transportation Needs: No Transportation Needs (03/29/2023)   PRAPARE - Administrator, Civil Service (Medical): No    Lack of Transportation (Non-Medical): No  Physical  Activity: Sufficiently Active (03/29/2023)   Exercise Vital Sign    Days of Exercise per Week: 6 days    Minutes of Exercise per Session: 50 min  Stress: No Stress Concern Present (03/29/2023)   [employer] of Occupational Health - Occupational Stress Questionnaire    Feeling of Stress : Only a little  Social Connections: Unknown (03/29/2023)   Social Connection and Isolation Panel    Frequency of Communication with Friends and Family: More than three times a week    Frequency of Social Gatherings with Friends and Family: More than three times a week    Attends Religious Services: Not on Marketing executive or Organizations: Yes    Attends Banker Meetings: More than 4 times per year    Marital Status: Married   Outpatient Medications Prior to Visit  Medication Sig   Multiple Vitamin (MULTIVITAMIN) tablet Take 1 tablet by mouth daily.   Omega-3 Fatty Acids (OMEGA III EPA+DHA PO) Take 1 tablet by mouth daily in the afternoon.   No facility-administered medications prior to visit.   No Known Allergies  Immunization History  Administered Date(s) Administered   Influenza,inj,Quad PF,6+ Mos 05/15/2019   Influenza-Unspecified 05/30/2015   PFIZER(Purple Top)SARS-COV-2 Vaccination 12/19/2019   Tdap 11/15/2011, 05/10/2023   Zoster Recombinant(Shingrix) 08/24/2021    Health Maintenance  Topic Date Due   Medicare Annual Wellness (AWV)  03/28/2024   COVID-19 Vaccine (2 - Pfizer risk series) 04/30/2024 (Originally 01/09/2020)   Zoster Vaccines- Shingrix (2 of 2) 06/03/2024 (Originally 10/19/2021)   INFLUENZA VACCINE  10/28/2024 (Originally 02/29/2024)   Pneumococcal Vaccine: 50+ Years (1 of 1 - PCV) 03/03/2025 (Originally 01/23/2006)   MAMMOGRAM  07/26/2024   Fecal DNA (Cologuard)  02/18/2026   DTaP/Tdap/Td (3 - Td or Tdap) 05/09/2033   DEXA SCAN  Completed   Hepatitis C Screening  Completed   Hepatitis B Vaccines  Aged Out   HPV VACCINES  Aged Out    Meningococcal B Vaccine  Aged Out   Colonoscopy  Discontinued    Patient Care Team: Omara Alcon, Lauraine SAILOR, DO as PCP - General (Family Medicine) Jinny Carmine, MD as Consulting Physician (Gastroenterology)  Review of Systems  Constitutional:  Negative for chills, fatigue and fever.  HENT:  Negative for congestion, ear pain, rhinorrhea, sneezing and sore throat.   Eyes: Negative.  Negative for pain and redness.  Respiratory:  Negative for cough, shortness of breath and wheezing.   Cardiovascular:  Negative for chest pain and leg swelling.  Gastrointestinal:  Negative for abdominal pain, blood in stool,  constipation, diarrhea and nausea.  Endocrine: Negative for polydipsia and polyphagia.  Genitourinary: Negative.  Negative for dysuria, flank pain, hematuria, pelvic pain, vaginal bleeding and vaginal discharge.  Musculoskeletal:  Negative for arthralgias, back pain, gait problem and joint swelling.  Skin:  Negative for rash.  Neurological: Negative.  Negative for dizziness, tremors, seizures, weakness, light-headedness, numbness and headaches.  Hematological:  Negative for adenopathy.  Psychiatric/Behavioral: Negative.  Negative for behavioral problems, confusion and dysphoric mood. The patient is not nervous/anxious and is not hyperactive.         Objective    BP (!) 150/71 (BP Location: Left Arm, Patient Position: Sitting, Cuff Size: Normal)   Pulse 65   Ht 5' 5 (1.651 m)   Wt 154 lb (69.9 kg)   SpO2 100%   BMI 25.63 kg/m     Physical Exam Vitals and nursing note reviewed.  Constitutional:      General: She is awake.     Appearance: Normal appearance.  HENT:     Head: Normocephalic and atraumatic.     Right Ear: Tympanic membrane, ear canal and external ear normal.     Left Ear: Tympanic membrane, ear canal and external ear normal.     Nose: Nose normal.     Mouth/Throat:     Mouth: Mucous membranes are moist.     Pharynx: Oropharynx is clear. No oropharyngeal exudate or  posterior oropharyngeal erythema.  Eyes:     General: No scleral icterus.    Extraocular Movements: Extraocular movements intact.     Conjunctiva/sclera: Conjunctivae normal.     Pupils: Pupils are equal, round, and reactive to light.  Neck:     Thyroid: No thyromegaly or thyroid tenderness.  Cardiovascular:     Rate and Rhythm: Normal rate and regular rhythm.     Pulses: Normal pulses.     Heart sounds: Normal heart sounds.  Pulmonary:     Effort: Pulmonary effort is normal. No tachypnea, bradypnea or respiratory distress.     Breath sounds: Normal breath sounds. No stridor. No wheezing, rhonchi or rales.  Abdominal:     General: Bowel sounds are normal. There is no distension.     Palpations: Abdomen is soft. There is no mass.     Tenderness: There is no abdominal tenderness. There is no guarding.     Hernia: No hernia is present.  Musculoskeletal:     Cervical back: Normal range of motion and neck supple.     Right lower leg: No edema.     Left lower leg: No edema.  Lymphadenopathy:     Cervical: No cervical adenopathy.  Skin:    General: Skin is warm and dry.     Comments: +varicose veins to BLE  Neurological:     Mental Status: She is alert and oriented to person, place, and time. Mental status is at baseline.  Psychiatric:        Mood and Affect: Mood normal.        Behavior: Behavior normal.     The 10-year ASCVD risk score (Arnett DK, et al., 2019) is: 10.1%   Values used to calculate the score:     Age: 60 years     Clincally relevant sex: Female     Is Non-Hispanic African American: No     Diabetic: No     Tobacco smoker: No     Systolic Blood Pressure: 150 mmHg     Is BP treated: No     HDL  Cholesterol: 58 mg/dL     Total Cholesterol: 196 mg/dL Calculated with reported home SBP of 112: 5.8%  Depress ion Screen    03/29/2023    1:41 PM 01/22/2023    9:15 AM 08/24/2021    9:25 AM 08/12/2021   10:59 AM  PHQ 2/9 Scores  PHQ - 2 Score 0 0 0 0  PHQ- 9  Score  0 1 0   No results found for any visits on 03/03/24.  Assessment & Plan     Encounter for medical examination to establish care  Moderate mixed hyperlipidemia not requiring statin therapy -     Lipid panel  Chronic kidney disease, stage 2, mildly decreased GFR -     Microalbumin / creatinine urine ratio -     Comprehensive metabolic panel with GFR  Elevated TSH -     TSH Rfx on Abnormal to Free T4  Vitamin D  deficiency -     VITAMIN D  25 Hydroxy (Vit-D Deficiency, Fractures)  Encounter for screening mammogram for breast cancer -     3D Screening Mammogram, Left and Right; Future     Encounter for medical examination to establish care Physical exam overall unremarkable except as noted above. Routine lab work ordered as noted.  Patient declines all vaccines today.  She will be due for mammogram in December.  Ordered screening mammogram for December 2025 today.  Moderate mixed hyperlipidemia not requiring statin therapy ASCVD risk score 10% using in-office BP, 5.8% using reported home BP. High risk in-office, moderate at home. - Order fasting lipid panel. - Discuss calcium cardiac score CT, potential insurance issues, cost $199 if not covered. - Continue lifestyle modifications: healthy diet, exercise.  Chronic kidney disease stage 2 Slightly reduced kidney function present over past several years. - Order uACR.  Elevated TSH Inconsistent abnormal thyroid levels, no clinical symptoms, no current concerns. - Order thyroid function tests.    Return in about 1 year (around 03/03/2025) for CPE, mAWV.     I discussed the assessment and treatment plan with the patient  The patient was provided an opportunity to ask questions and all were answered. The patient agreed with the plan and demonstrated an understanding of the instructions.   The patient was advised to call back or seek an in-person evaluation if the symptoms worsen or if the condition fails to improve as  anticipated.    LAURAINE LOISE BUOY, DO  [facility] [phone] (phone) [phone] (fax)  [facility]

## 2024-03-11 LAB — LIPID PANEL
Chol/HDL Ratio: 4 ratio (ref 0.0–4.4)
Cholesterol, Total: 230 mg/dL — ABNORMAL HIGH (ref 100–199)
HDL: 58 mg/dL (ref >39)
LDL Chol Calc (NIH): 160 mg/dL — ABNORMAL HIGH (ref 0–99)
Triglycerides: 71 mg/dL (ref 0–149)
VLDL Cholesterol Cal: 12 mg/dL (ref 5–40)

## 2024-03-11 LAB — COMPREHENSIVE METABOLIC PANEL WITH GFR
ALT: 15 IU/L (ref 0–32)
AST: 26 IU/L (ref 0–40)
Albumin: 4.6 g/dL (ref 3.9–4.9)
Alkaline Phosphatase: 67 IU/L (ref 44–121)
BUN/Creatinine Ratio: 19 (ref 12–28)
BUN: 19 mg/dL (ref 8–27)
Bilirubin Total: 0.7 mg/dL (ref 0.0–1.2)
CO2: 21 mmol/L (ref 20–29)
Calcium: 9.6 mg/dL (ref 8.7–10.3)
Chloride: 100 mmol/L (ref 96–106)
Creatinine, Ser: 1 mg/dL (ref 0.57–1.00)
Globulin, Total: 2.4 g/dL (ref 1.5–4.5)
Glucose: 88 mg/dL (ref 70–99)
Potassium: 4.6 mmol/L (ref 3.5–5.2)
Sodium: 136 mmol/L (ref 134–144)
Total Protein: 7 g/dL (ref 6.0–8.5)
eGFR: 61 mL/min/1.73 (ref >59)

## 2024-03-11 LAB — MICROALBUMIN / CREATININE URINE RATIO
Creatinine, Urine: 24 mg/dL
Microalb/Creat Ratio: 31 mg/g{creat} — ABNORMAL HIGH (ref 0–29)
Microalbumin, Urine: 7.5 ug/mL

## 2024-03-11 LAB — T4F: T4,Free (Direct): 1.18 ng/dL (ref 0.82–1.77)

## 2024-03-11 LAB — VITAMIN D 25 HYDROXY (VIT D DEFICIENCY, FRACTURES): Vit D, 25-Hydroxy: 51.2 ng/mL (ref 30.0–100.0)

## 2024-03-11 LAB — TSH RFX ON ABNORMAL TO FREE T4: TSH: 4.91 u[IU]/mL — ABNORMAL HIGH (ref 0.450–4.500)

## 2024-03-17 ENCOUNTER — Ambulatory Visit: Payer: Self-pay | Admitting: Family Medicine

## 2024-06-24 ENCOUNTER — Encounter: Payer: Medicare PPO | Admitting: Dermatology

## 2024-07-02 ENCOUNTER — Ambulatory Visit: Admitting: Dermatology

## 2024-07-02 DIAGNOSIS — W908XXA Exposure to other nonionizing radiation, initial encounter: Secondary | ICD-10-CM

## 2024-07-02 DIAGNOSIS — L570 Actinic keratosis: Secondary | ICD-10-CM

## 2024-07-02 DIAGNOSIS — Z79899 Other long term (current) drug therapy: Secondary | ICD-10-CM

## 2024-07-02 DIAGNOSIS — Z7189 Other specified counseling: Secondary | ICD-10-CM | POA: Diagnosis not present

## 2024-07-02 DIAGNOSIS — L578 Other skin changes due to chronic exposure to nonionizing radiation: Secondary | ICD-10-CM | POA: Diagnosis not present

## 2024-07-02 NOTE — Patient Instructions (Addendum)
 Cryotherapy Aftercare  Wash gently with soap and water everyday.   Apply Vaseline and Band-Aid daily until healed.   - Start 5-fluorouracil/calcipotriene cream twice a day for 5-7 days to affected areas including cheeks, nose. Prescription sent to [facility]. Patient advised they will receive an email to purchase the medication online and have it sent to their home. Patient provided with handout reviewing treatment course and side effects and advised to call or message us  on MyChart with any concerns.  Reviewed course of treatment and expected reaction.  Patient advised to expect inflammation and crusting and advised that erosions are possible.  Patient advised to be diligent with sun protection during and after treatment. Counseled to keep medication out of reach of children and pets.  Instructions for [facility]  One or more of your medications was sent to the [facility] mail order [facility] pharmacy. You will receive an email from them and can purchase the medicine through that link. It will then be mailed to your home at the address you confirmed. If for any reason you do not receive an email from them, please check your spam folder. If you still do not find the email, please let us  know. [facility] phone number is [phone].   5-Fluorouracil/Calcipotriene Patient Education   Actinic keratoses are the dry, red scaly spots on the skin caused by sun damage. A portion of these spots can turn into skin cancer with time, and treating them can help prevent development of skin cancer.   Treatment of these spots requires removal of the defective skin cells. There are various ways to remove actinic keratoses, including freezing with liquid nitrogen, treatment with creams, or treatment with a blue light procedure in the office.   5-fluorouracil cream is a topical cream used to treat actinic keratoses. It works by interfering with the growth of  abnormal fast-growing skin cells, such as actinic keratoses. These cells peel off and are replaced by healthy ones.   5-fluorouracil/calcipotriene is a combination of the 5-fluorouracil cream with a vitamin D  analog cream called calcipotriene. The calcipotriene alone does not treat actinic keratoses. However, when it is combined with 5-fluorouracil, it helps the 5-fluorouracil treat the actinic keratoses much faster so that the same results can be achieved with a much shorter treatment time.  INSTRUCTIONS FOR 5-FLUOROURACIL/CALCIPOTRIENE CREAM:   5-fluorouracil/calcipotriene cream typically only needs to be used for 4-7 days. A thin layer should be applied twice a day to the treatment areas recommended by your physician.   If your physician prescribed you separate tubes of 5-fluourouracil and calcipotriene, apply a thin layer of 5-fluorouracil followed by a thin layer of calcipotriene.   Avoid contact with your eyes, nostrils, and mouth. Do not use 5-fluorouracil/calcipotriene cream on infected or open wounds.   You will develop redness, irritation and some crusting at areas where you have pre-cancer damage/actinic keratoses. IF YOU DEVELOP PAIN, BLEEDING, OR SIGNIFICANT CRUSTING, STOP THE TREATMENT EARLY - you have already gotten a good response and the actinic keratoses should clear up well.  Wash your hands after applying 5-fluorouracil 5% cream on your skin.   A moisturizer or sunscreen with a minimum SPF 30 should be applied each morning.   Once you have finished the treatment, you can apply a thin layer of Vaseline twice a day to irritated areas to soothe and calm the areas more quickly. If you experience significant discomfort, contact your physician.  For some patients it is necessary to repeat the  treatment for best results.  SIDE EFFECTS: When using 5-fluorouracil/calcipotriene cream, you may have mild irritation, such as redness, dryness, swelling, or a mild burning sensation. This  usually resolves within 2 weeks. The more actinic keratoses you have, the more redness and inflammation you can expect during treatment. Eye irritation has been reported rarely. If this occurs, please let us  know.  If you have any trouble using this cream, please call the office. If you have any other questions about this information, please do not hesitate to ask me before you leave the office.   Due to recent changes in healthcare laws, you may see results of your pathology and/or laboratory studies on MyChart before the doctors have had a chance to review them. We understand that in some cases there may be results that are confusing or concerning to you. Please understand that not all results are received at the same time and often the doctors may need to interpret multiple results in order to provide you with the best plan of care or course of treatment. Therefore, we ask that you please give us  2 business days to thoroughly review all your results before contacting the office for clarification. Should we see a critical lab result, you will be contacted sooner.   If You Need Anything After Your Visit  If you have any questions or concerns for your doctor, please call our main line at [phone] and press option 4 to reach your doctor's medical assistant. If no one answers, please leave a voicemail as directed and we will return your call as soon as possible. Messages left after 4 pm will be answered the following business day.   You may also send us  a message via MyChart. We typically respond to MyChart messages within 1-2 business days.  For prescription refills, please ask your pharmacy to contact our office. Our fax number is [phone].  If you have an urgent issue when the clinic is closed that cannot wait until the next business day, you can page your doctor at the number below.    Please note that while we do our best to be available for urgent issues outside of office hours, we are  not available 24/7.   If you have an urgent issue and are unable to reach us , you may choose to seek medical care at your doctor's office, retail clinic, urgent care center, or emergency room.  If you have a medical emergency, please immediately call 911 or go to the emergency department.  Pager Numbers  - Dr. Hester: [phone]  - Dr. Jackquline: [phone]  - Dr. Claudene: [phone]   - Dr. Raymund: [phone]  In the event of inclement weather, please call our main line at [phone] for an update on the status of any delays or closures.  Dermatology Medication Tips: Please keep the boxes that topical medications come in in order to help keep track of the instructions about where and how to use these. Pharmacies typically print the medication instructions only on the boxes and not directly on the medication tubes.   If your medication is too expensive, please contact our office at [phone] option 4 or send us  a message through MyChart.   We are unable to tell what your co-pay for medications will be in advance as this is different depending on your insurance coverage. However, we may be able to find a substitute medication at lower cost or fill out paperwork to get insurance to cover a needed medication.   If  a prior authorization is required to get your medication covered by your insurance company, please allow us  1-2 business days to complete this process.  Drug prices often vary depending on where the prescription is filled and some pharmacies may offer cheaper prices.  The website www.goodrx.com contains coupons for medications through different pharmacies. The prices here do not account for what the cost may be with help from insurance (it may be cheaper with your insurance), but the website can give you the price if you did not use any insurance.  - You can print the associated coupon and take it with your prescription to the pharmacy.  - You may also stop by our  office during regular business hours and pick up a GoodRx coupon card.  - If you need your prescription sent electronically to a different pharmacy, notify our office through [facility] or by phone at [phone] option 4.     Si Usted Necesita Algo Despus de Su Visita  Tambin puede enviarnos un mensaje a travs de Clinical Cytogeneticist. Por lo general respondemos a los mensajes de MyChart en el transcurso de 1 a 2 das hbiles.  Para renovar recetas, por favor pida a su farmacia que se ponga en contacto con nuestra oficina. Randi lakes de fax es [place] [phone].  Si tiene un asunto urgente cuando la clnica est cerrada y que no puede esperar hasta el siguiente da hbil, puede llamar/localizar a su doctor(a) al nmero que aparece a continuacin.   Por favor, tenga en cuenta que aunque hacemos todo lo posible para estar disponibles para asuntos urgentes fuera del horario de [place], no estamos disponibles las 24 horas del da, los 7 [address]  [address] de la [place].   Si tiene un problema urgente y no puede comunicarse con nosotros, puede optar por buscar atencin mdica  en el consultorio de su doctor(a), en una clnica privada, en un centro de atencin urgente o en una sala de emergencias.  Si tiene engineer, drilling, por favor llame inmediatamente al 911 o vaya a la sala de emergencias.  Nmeros de bper  - Dr. Hester: [phone]  - Dra. Jackquline: [phone]  - Dr. Claudene: [phone]  - Dra. Kitts: [phone]  En caso de inclemencias del [place], por favor llame a nuestra lnea principal al [phone] para una actualizacin sobre el estado de cualquier retraso o cierre.  Consejos para la medicacin en dermatologa: Por favor, guarde las cajas en las que vienen los medicamentos de uso tpico para ayudarle a seguir las instrucciones sobre dnde y cmo usarlos. Las farmacias generalmente imprimen las instrucciones del medicamento slo en las cajas y no directamente en los tubos del  [place].   Si su medicamento es muy caro, por favor, pngase en contacto con landry rieger llamando al [phone] y presione la opcin 4 o envenos un mensaje a travs de Clinical Cytogeneticist.   No podemos decirle cul ser su copago por los medicamentos por adelantado ya que esto es diferente dependiendo de la cobertura de su seguro. Sin embargo, es posible que podamos encontrar un medicamento sustituto a audiological scientist un formulario para que el seguro cubra el medicamento que se considera necesario.   Si se requiere una autorizacin previa para que su compaa de seguros cubra su medicamento, por favor permtanos de 1 a 2 das hbiles para completar este proceso.  Los precios de los medicamentos varan con frecuencia dependiendo del environmental consultant de dnde se surte la receta y alguna farmacias pueden ofrecer precios ms baratos.  El sitio web www.goodrx.com tiene cupones para medicamentos de health and safety inspector. Los precios aqu no tienen en cuenta lo que podra costar con la ayuda del seguro (puede ser ms barato con su seguro), pero el sitio web puede darle el precio si no utiliz tourist information centre manager.  - Puede imprimir el cupn correspondiente y llevarlo con su receta a la farmacia.  - Tambin puede pasar por nuestra oficina durante el horario de atencin regular y education officer, museum una tarjeta de cupones de GoodRx.  - Si necesita que su receta se enve electrnicamente a una farmacia diferente, informe a nuestra oficina a travs de MyChart de [place] o por telfono llamando al [phone] y presione la opcin 4.

## 2024-07-02 NOTE — Progress Notes (Signed)
 Follow-Up Visit   Subjective  Paula Hodges is a 68 y.o. female who presents for the following: Rough spots on the cheeks, noticed recently. History of Aks. She has treated precancers with topical 5FU/Calcipotriene cream in the past.   The patient has spots, moles and lesions to be evaluated, some may be new or changing.    The following portions of the chart were reviewed this encounter and updated as appropriate: medications, allergies, medical history  Review of Systems:  No other skin or systemic complaints except as noted in HPI or Assessment and Plan.  Objective  Well appearing patient in no apparent distress; mood and affect are within normal limits.  A focused examination was performed of the following areas: Face  Relevant physical exam findings are noted in the Assessment and Plan.  R zygoma x 1, R medial cheek x 1, R malar cheek x 1, L malar cheek x 1, L nasal tip x 1 (5) Keratotic macules  Assessment & Plan  ACTINIC DAMAGE WITH PRECANCEROUS ACTINIC KERATOSES Counseling for Topical Chemotherapy Management: Patient exhibits: - Severe, confluent actinic changes with pre-cancerous actinic keratoses that is secondary to cumulative UV radiation exposure over time - Condition that is severe; chronic, not at goal. - diffuse scaly erythematous macules and papules with underlying dyspigmentation - Discussed Prescription Field Treatment topical Chemotherapy for Severe, Chronic Confluent Actinic Changes with Pre-Cancerous Actinic Keratoses Field treatment involves treatment of an entire area of skin that has confluent Actinic Changes (Sun/ Ultraviolet light damage) and PreCancerous Actinic Keratoses by method of PhotoDynamic Therapy (PDT) and/or prescription Topical Chemotherapy agents such as 5-fluorouracil, 5-fluorouracil/calcipotriene, and/or imiquimod.  The purpose is to decrease the number of clinically evident and subclinical PreCancerous lesions to prevent  progression to development of skin cancer by chemically destroying early precancer changes that may or may not be visible.  It has been shown to reduce the risk of developing skin cancer in the treated area. As a result of treatment, redness, scaling, crusting, and open sores may occur during treatment course. One or more than one of these methods may be used and may have to be used several times to control, suppress and eliminate the PreCancerous changes. Discussed treatment course, expected reaction, and possible side effects. - Recommend daily broad spectrum sunscreen SPF 30+ to sun-exposed areas, reapply every 2 hours as needed.  - Staying in the shade or wearing long sleeves, sun glasses (UVA+UVB protection) and wide brim hats (4-inch brim around the entire circumference of the hat) are also recommended. - Call for new or changing lesions. - Discussed red light PDT with debridement to face.  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  - Start 5-fluorouracil/calcipotriene cream twice a day for 5-7 days to affected areas including cheeks, nose. Prescription sent to [facility]. Patient advised they will receive an email to purchase the medication online and have it sent to their home. Patient provided with handout reviewing treatment course and side effects and advised to call or message us  on MyChart with any concerns.  Reviewed course of treatment and expected reaction.  Patient advised to expect inflammation and crusting and advised that erosions are possible.  Patient advised to be diligent with sun protection during and after treatment. Counseled to keep medication out of reach of children and pets.   AK (ACTINIC KERATOSIS) (5) R zygoma x 1, R medial cheek x 1, R malar cheek x 1, L malar cheek x  1, L nasal tip x 1 (5) Actinic keratoses are precancerous spots that appear secondary to cumulative UV radiation exposure/sun exposure over time. They are chronic with  expected duration over 1 year. A portion of actinic keratoses will progress to squamous cell carcinoma of the skin. It is not possible to reliably predict which spots will progress to skin cancer and so treatment is recommended to prevent development of skin cancer.  Recommend daily broad spectrum sunscreen SPF 30+ to sun-exposed areas, reapply every 2 hours as needed.  Recommend staying in the shade or wearing long sleeves, sun glasses (UVA+UVB protection) and wide brim hats (4-inch brim around the entire circumference of the hat). Call for new or changing lesions. Destruction of lesion - R zygoma x 1, R medial cheek x 1, R malar cheek x 1, L malar cheek x 1, L nasal tip x 1 (5)  Destruction method: cryotherapy   Informed consent: discussed and consent obtained   Lesion destroyed using liquid nitrogen: Yes   Region frozen until ice ball extended beyond lesion: Yes   Outcome: patient tolerated procedure well with no complications   Post-procedure details: wound care instructions given   Additional details:  Prior to procedure, discussed risks of blister formation, small wound, skin dyspigmentation, or rare scar following cryotherapy. Recommend Vaseline ointment to treated areas while healing.     Return in about 6 months (around 12/31/2024) for TBSE, Hx AKs.  IAndrea Kerns, CMA, am acting as scribe for Rexene Rattler, MD   Documentation: I have reviewed the above documentation for accuracy and completeness, and I agree with the above.  Rexene Rattler, MD

## 2024-09-01 ENCOUNTER — Encounter

## 2024-09-01 ENCOUNTER — Ambulatory Visit

## 2025-01-19 ENCOUNTER — Ambulatory Visit: Admitting: Dermatology
